# Patient Record
Sex: Female | Born: 1995 | Hispanic: Yes | Marital: Single | Smoking: Never smoker
Health system: Southern US, Community
[De-identification: ages and names within clinical notes are randomized; demographics above are authoritative.]

## PROBLEM LIST (undated history)

## (undated) DIAGNOSIS — Z789 Other specified health status: Secondary | ICD-10-CM

## (undated) HISTORY — PX: NO PAST SURGERIES: SHX2092

---
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
## 2020-01-14 ENCOUNTER — Other Ambulatory Visit: Payer: Self-pay

## 2020-01-14 ENCOUNTER — Emergency Department: Payer: No Typology Code available for payment source

## 2020-01-14 ENCOUNTER — Encounter: Payer: Self-pay | Admitting: Emergency Medicine

## 2020-01-14 ENCOUNTER — Emergency Department
Admission: EM | Admit: 2020-01-14 | Discharge: 2020-01-14 | Disposition: A | Discharge status: Home / Self Care | Payer: No Typology Code available for payment source | Attending: Emergency Medicine | Admitting: Emergency Medicine

## 2020-01-14 DIAGNOSIS — R0789 Other chest pain: Secondary | ICD-10-CM | POA: Insufficient documentation

## 2020-01-14 DIAGNOSIS — R1013 Epigastric pain: Secondary | ICD-10-CM | POA: Diagnosis not present

## 2020-01-14 DIAGNOSIS — R112 Nausea with vomiting, unspecified: Secondary | ICD-10-CM | POA: Diagnosis not present

## 2020-01-14 LAB — COMPREHENSIVE METABOLIC PANEL
ALT: 15 U/L (ref 0–44)
AST: 21 U/L (ref 15–41)
Albumin: 4 g/dL (ref 3.5–5.0)
Alkaline Phosphatase: 66 U/L (ref 38–126)
Anion gap: 9 (ref 5–15)
BUN: 11 mg/dL (ref 6–20)
CO2: 21 mmol/L — ABNORMAL LOW (ref 22–32)
Calcium: 9.1 mg/dL (ref 8.9–10.3)
Chloride: 105 mmol/L (ref 98–111)
Creatinine, Ser: 0.53 mg/dL (ref 0.44–1.00)
GFR, Estimated: >60 mL/min (ref >60)
Glucose, Bld: 111 mg/dL — ABNORMAL HIGH (ref 70–99)
Potassium: 3.2 mmol/L — ABNORMAL LOW (ref 3.5–5.1)
Sodium: 135 mmol/L (ref 135–145)
Total Bilirubin: 0.8 mg/dL (ref 0.3–1.2)
Total Protein: 8.1 g/dL (ref 6.5–8.1)

## 2020-01-14 LAB — CBC WITH DIFFERENTIAL/PLATELET
Abs Immature Granulocytes: 0.02 10*3/uL (ref 0.00–0.07)
Basophils Absolute: 0 10*3/uL (ref 0.0–0.1)
Basophils Relative: 0 %
Eosinophils Absolute: 0.1 10*3/uL (ref 0.0–0.5)
Eosinophils Relative: 2 %
HCT: 36.9 % (ref 36.0–46.0)
Hemoglobin: 12.6 g/dL (ref 12.0–15.0)
Immature Granulocytes: 0 %
Lymphocytes Relative: 37 %
Lymphs Abs: 2.8 10*3/uL (ref 0.7–4.0)
MCH: 31.3 pg (ref 26.0–34.0)
MCHC: 34.1 g/dL (ref 30.0–36.0)
MCV: 91.6 fL (ref 80.0–100.0)
Monocytes Absolute: 0.4 10*3/uL (ref 0.1–1.0)
Monocytes Relative: 5 %
Neutro Abs: 4.2 10*3/uL (ref 1.7–7.7)
Neutrophils Relative %: 56 %
Platelets: 294 10*3/uL (ref 150–400)
RBC: 4.03 MIL/uL (ref 3.87–5.11)
RDW: 12.8 % (ref 11.5–15.5)
WBC: 7.5 10*3/uL (ref 4.0–10.5)
nRBC: 0 % (ref 0.0–0.2)

## 2020-01-14 LAB — URINALYSIS, COMPLETE (UACMP) WITH MICROSCOPIC
Bacteria, UA: NONE SEEN
Bilirubin Urine: NEGATIVE
Glucose, UA: NEGATIVE mg/dL
Ketones, ur: NEGATIVE mg/dL
Leukocytes,Ua: NEGATIVE
Nitrite: NEGATIVE
Protein, ur: NEGATIVE mg/dL
Specific Gravity, Urine: 1.025 (ref 1.005–1.030)
pH: 5 (ref 5.0–8.0)

## 2020-01-14 LAB — POC URINE PREG, ED: Preg Test, Ur: NEGATIVE

## 2020-01-14 LAB — LIPASE, BLOOD: Lipase: 31 U/L (ref 11–51)

## 2020-01-14 LAB — TROPONIN I (HIGH SENSITIVITY)
Troponin I (High Sensitivity): <2 ng/L (ref <18)
Troponin I (High Sensitivity): <2 ng/L (ref <18)

## 2020-01-14 MED ORDER — OMEPRAZOLE 20 MG PO CPDR: 20.0000 mg | DELAYED_RELEASE_CAPSULE | Freq: Every day | ORAL | 1 refills | Status: DC

## 2020-01-14 NOTE — ED Provider Notes (Signed)
[facility] Emergency Department Provider Note   ____________________________________________   First MD Initiated Contact with Patient 01/14/20 [phone]     (approximate)  I have reviewed the triage vital signs and the nursing notes.   HISTORY  Chief Complaint Chest Pain and Abdominal Pain    HPI Melissa Macdonald is a 24 y.o. female with no stated past medical history the presents for chest tightness with associated midepigastric abdominal pain that began at approximately 4 AM prior to arrival.  Patient states associated nausea and one episode of nonbloody nonbilious emesis.  Patient states she has had similar symptoms in the past but always wake her from sleep and usually occur after eating a spicy or greasy meal.  Patient is concerned that she may have gallstones or gallbladder issues as her mother had similar symptoms when she had to get her gallbladder taken out.  Patient denies any dyspnea on exertion, diarrhea, fevers, vision changes, tinnitus, or weakness/numbness/paresthesias in any extremity.         History reviewed. No pertinent past medical history.  There are no problems to display for this patient.   History reviewed. No pertinent surgical history.  Prior to Admission medications   Medication Sig Start Date End Date Taking? Authorizing Provider  omeprazole (PRILOSEC) 20 MG capsule Take 1 capsule (20 mg total) by mouth at bedtime. 01/14/20 01/13/21  Merwyn Katos, MD    Allergies Patient has no known allergies.  No family history on file.  Social History Social History   Tobacco Use  . Smoking status: Never Smoker  . Smokeless tobacco: Never Used  Substance Use Topics  . Alcohol use: Not on file  . Drug use: Not on file    Review of Systems Constitutional: No fever/chills Eyes: No visual changes. ENT: No sore throat. Cardiovascular: Endorses chest pain. Respiratory: Denies shortness of breath. Gastrointestinal: No abdominal  pain.  Endorses nausea, no vomiting.  No diarrhea. Genitourinary: Negative for dysuria. Musculoskeletal: Negative for acute arthralgias Skin: Negative for rash. Neurological: Negative for headaches, weakness/numbness/paresthesias in any extremity Psychiatric: Negative for suicidal ideation/homicidal ideation   ____________________________________________   PHYSICAL EXAM:  VITAL SIGNS: ED Triage Vitals  Enc Vitals Group     BP 01/14/20 0609 115/67     Pulse Rate 01/14/20 0609 79     Resp 01/14/20 0609 20     Temp 01/14/20 0609 98.3 F (36.8 C)     Temp Source 01/14/20 0609 Oral     SpO2 01/14/20 0609 98 %     Weight 01/14/20 0608 135 lb (61.2 kg)     Height 01/14/20 0608 5\' 2"  (1.575 m)     Head Circumference --      Peak Flow --      Pain Score 01/14/20 0608 7     Pain Loc --      Pain Edu? --      Excl. in GC? --    Constitutional: Alert and oriented. Well appearing and in no acute distress. Eyes: Conjunctivae are normal. PERRL. Head: Atraumatic. Nose: No congestion/rhinnorhea. Mouth/Throat: Mucous membranes are moist. Neck: No stridor Cardiovascular: Grossly normal heart sounds.  Good peripheral circulation. Respiratory: Normal respiratory effort.  No retractions. Gastrointestinal: Soft and nontender. No distention. Musculoskeletal: No obvious deformities Neurologic:  Normal speech and language. No gross focal neurologic deficits are appreciated. Skin:  Skin is warm and dry. No rash noted. Psychiatric: Mood and affect are normal. Speech and behavior are normal.  ____________________________________________   LABS (  all labs ordered are listed, but only abnormal results are displayed)  Labs Reviewed  COMPREHENSIVE METABOLIC PANEL - Abnormal; Notable for the following components:      Result Value   Potassium 3.2 (*)    CO2 21 (*)    Glucose, Bld 111 (*)    All other components within normal limits  URINALYSIS, COMPLETE (UACMP) WITH MICROSCOPIC - Abnormal;  Notable for the following components:   Color, Urine YELLOW (*)    APPearance HAZY (*)    Hgb urine dipstick MODERATE (*)    All other components within normal limits  CBC WITH DIFFERENTIAL/PLATELET  LIPASE, BLOOD  POC URINE PREG, ED  TROPONIN I (HIGH SENSITIVITY)  TROPONIN I (HIGH SENSITIVITY)   ____________________________________________  EKG  ED ECG REPORT I, Merwyn Katos, the attending physician, personally viewed and interpreted this ECG.  Date: 01/14/2020 EKG Time: 0608 Rate: 70 Rhythm: normal sinus rhythm QRS Axis: normal Intervals: normal ST/T Wave abnormalities: normal Narrative Interpretation: no evidence of acute ischemia  ____________________________________________  RADIOLOGY  ED MD interpretation: 2 view x-ray of the chest shows no evidence of acute abnormalities including no pneumonia, pneumothorax, or widened mediastinum  Official radiology report(s): DG Chest 2 View  Result Date: 01/14/2020 CLINICAL DATA:  Chest pain EXAM: CHEST - 2 VIEW COMPARISON:  None. FINDINGS: Normal heart size and mediastinal contours. No acute infiltrate or edema. No effusion or pneumothorax. No acute osseous findings. IMPRESSION: No active cardiopulmonary disease. Electronically Signed   By: Marnee Spring M.D.   On: 01/14/2020 06:34    ____________________________________________   PROCEDURES  Procedure(s) performed (including Critical Care):  .1-3 Lead EKG Interpretation Performed by: Merwyn Katos, MD Authorized by: Merwyn Katos, MD     Interpretation: normal     ECG rate:  68   ECG rate assessment: normal     Rhythm: sinus rhythm     Ectopy: none     Conduction: normal       ____________________________________________   INITIAL IMPRESSION / ASSESSMENT AND PLAN / ED COURSE  As part of my medical decision making, I reviewed the following data within the electronic MEDICAL RECORD NUMBER Nursing notes reviewed and incorporated, Labs reviewed, EKG  interpreted, Old chart reviewed, Radiograph reviewed and Notes from prior ED visits reviewed and incorporated        Workup: ECG, CXR, CBC, BMP, Troponin Findings: ECG: No overt evidence of STEMI. No evidence of Brugadas sign, delta wave, epsilon wave, significantly prolonged QTc, or malignant arrhythmia HS Troponin: Negative x1 Other Labs unremarkable for emergent problems. CXR: Without PTX, PNA, or widened mediastinum Last Stress Test: Never Last Heart Catheterization: Never HEART Score: 1  Given History, Exam, and Workup I have low suspicion for ACS, Pneumothorax, Pneumonia, Pulmonary Embolus, Tamponade, Aortic Dissection or other emergent problem as a cause for this presentation.   Reassesment: Prior to discharge patients pain was controlled and they were well appearing.  Disposition:  Discharge. Strict return precautions discussed with patient with full understanding. Advised patient to follow up promptly with primary care provider      ____________________________________________   FINAL CLINICAL IMPRESSION(S) / ED DIAGNOSES  Final diagnoses:  Chest tightness  Nausea and vomiting, intractability of vomiting not specified, unspecified vomiting type     ED Discharge Orders         Ordered    US Abdomen Limited RUQ (LIVER/GB)        01/14/20 0954    omeprazole (PRILOSEC) 20 MG capsule  Daily  at bedtime        01/14/20 5638           Note:  This document was prepared using Dragon voice recognition software and may include unintentional dictation errors.   Merwyn Katos, MD 01/14/20 1000

## 2020-01-14 NOTE — ED Triage Notes (Signed)
Patient ambulatory to triage with steady gait, without difficulty or distress noted; pt reports since 4am having chest and abd pain accomp by nausea

## 2020-02-02 ENCOUNTER — Ambulatory Visit: Payer: PRIVATE HEALTH INSURANCE

## 2020-03-06 ENCOUNTER — Encounter: Payer: Self-pay | Admitting: Emergency Medicine

## 2020-03-06 ENCOUNTER — Other Ambulatory Visit: Payer: Self-pay

## 2020-03-06 DIAGNOSIS — K8050 Calculus of bile duct without cholangitis or cholecystitis without obstruction: Secondary | ICD-10-CM | POA: Diagnosis not present

## 2020-03-06 DIAGNOSIS — R1013 Epigastric pain: Secondary | ICD-10-CM | POA: Diagnosis present

## 2020-03-06 NOTE — ED Triage Notes (Signed)
Patient with complaint of epigastric pain that radiates into her back that started yesterday. Patient states that she was seen her a month ago for the same and diagnosed with acid reflux. Patient states that she has not taken the medication that she was prescribed.

## 2020-03-07 ENCOUNTER — Emergency Department
Admission: EM | Admit: 2020-03-07 | Discharge: 2020-03-07 | Disposition: A | Discharge status: Home / Self Care | Payer: No Typology Code available for payment source | Attending: Emergency Medicine | Admitting: Emergency Medicine

## 2020-03-07 ENCOUNTER — Emergency Department: Payer: No Typology Code available for payment source

## 2020-03-07 DIAGNOSIS — K8050 Calculus of bile duct without cholangitis or cholecystitis without obstruction: Secondary | ICD-10-CM

## 2020-03-07 DIAGNOSIS — R1013 Epigastric pain: Secondary | ICD-10-CM

## 2020-03-07 LAB — COMPREHENSIVE METABOLIC PANEL
ALT: 14 U/L (ref 0–44)
AST: 17 U/L (ref 15–41)
Albumin: 4.2 g/dL (ref 3.5–5.0)
Alkaline Phosphatase: 68 U/L (ref 38–126)
Anion gap: 9 (ref 5–15)
BUN: 10 mg/dL (ref 6–20)
CO2: 23 mmol/L (ref 22–32)
Calcium: 9 mg/dL (ref 8.9–10.3)
Chloride: 103 mmol/L (ref 98–111)
Creatinine, Ser: 0.47 mg/dL (ref 0.44–1.00)
GFR, Estimated: >60 mL/min (ref >60)
Glucose, Bld: 108 mg/dL — ABNORMAL HIGH (ref 70–99)
Potassium: 3.2 mmol/L — ABNORMAL LOW (ref 3.5–5.1)
Sodium: 135 mmol/L (ref 135–145)
Total Bilirubin: 0.8 mg/dL (ref 0.3–1.2)
Total Protein: 8.1 g/dL (ref 6.5–8.1)

## 2020-03-07 LAB — CBC
HCT: 35.4 % — ABNORMAL LOW (ref 36.0–46.0)
Hemoglobin: 12.1 g/dL (ref 12.0–15.0)
MCH: 31.1 pg (ref 26.0–34.0)
MCHC: 34.2 g/dL (ref 30.0–36.0)
MCV: 91 fL (ref 80.0–100.0)
Platelets: 256 10*3/uL (ref 150–400)
RBC: 3.89 MIL/uL (ref 3.87–5.11)
RDW: 12.7 % (ref 11.5–15.5)
WBC: 9.9 10*3/uL (ref 4.0–10.5)
nRBC: 0 % (ref 0.0–0.2)

## 2020-03-07 LAB — URINALYSIS, COMPLETE (UACMP) WITH MICROSCOPIC
Bacteria, UA: NONE SEEN
Bilirubin Urine: NEGATIVE
Glucose, UA: NEGATIVE mg/dL
Ketones, ur: NEGATIVE mg/dL
Leukocytes,Ua: NEGATIVE
Nitrite: NEGATIVE
Protein, ur: NEGATIVE mg/dL
Specific Gravity, Urine: 1.014 (ref 1.005–1.030)
pH: 7 (ref 5.0–8.0)

## 2020-03-07 LAB — TROPONIN I (HIGH SENSITIVITY): Troponin I (High Sensitivity): <2 ng/L (ref <18)

## 2020-03-07 LAB — LIPASE, BLOOD: Lipase: 31 U/L (ref 11–51)

## 2020-03-07 LAB — POC URINE PREG, ED: Preg Test, Ur: NEGATIVE

## 2020-03-07 MED ORDER — ONDANSETRON 4 MG PO TBDP: ORAL_TABLET | ORAL | 0 refills | Status: DC

## 2020-03-07 MED ORDER — DOCUSATE SODIUM 100 MG PO CAPS: ORAL_CAPSULE | ORAL | 0 refills | Status: DC

## 2020-03-07 MED ORDER — HYDROCODONE-ACETAMINOPHEN 5-325 MG PO TABS: 2.0000 | ORAL_TABLET | Freq: Four times a day (QID) | ORAL | 0 refills | Status: DC | PRN

## 2020-03-07 MED ORDER — POTASSIUM CHLORIDE CRYS ER 20 MEQ PO TBCR
40.0000 meq | EXTENDED_RELEASE_TABLET | Freq: Once | ORAL | Status: AC
  Administered 2020-03-07: 40 meq via ORAL
  Filled 2020-03-07: qty 2

## 2020-03-07 NOTE — Discharge Instructions (Addendum)
You have been seen in the Emergency Department (ED) for abdominal pain.  Your evaluation suggests that your pain is caused by gallstones.  Fortunately you do not need immediate surgery at this time, but it is important that you follow up with a surgeon as an outpatient; typically surgical removal of the gallbladder is the only thing that will definitively fix your issue.  Read through the included information about a bland diet, and use any prescribed medications as instructed.  Avoid smoking and alcohol use. ? ?Please follow up as instructed above regarding today?s emergent visit and the symptoms that are bothering you. ? ?Take Norco as prescribed. Do not drink alcohol, drive or participate in any other potentially dangerous activities while taking this medication as it may make you sleepy. Do not take this medication with any other sedating medications, either prescription or over-the-counter. If you were prescribed Percocet or Vicodin, do not take these with acetaminophen (Tylenol) as it is already contained within these medications. ?  ?This medication is an opiate (or narcotic) pain medication and can be habit forming.  Use it as little as possible to achieve adequate pain control.  Do not use or use it with extreme caution if you have a history of opiate abuse or dependence.  If you are on a pain contract with your primary care doctor or a pain specialist, be sure to let them know you were prescribed this medication today from the [facility].  This medication is intended for your use only - do not give any to anyone else and keep it in a secure place where nobody else, especially children, have access to it.  It will also cause or worsen constipation, so you may want to consider taking an over-the-counter stool softener while you are taking this medication. ? ?Return to the ED if your abdominal pain worsens or fails to improve, you develop bloody vomiting, bloody diarrhea, you are  unable to tolerate fluids due to vomiting, fever greater than 101, or other symptoms that concern you. ? ?

## 2020-03-07 NOTE — ED Notes (Signed)
ED Provider at bedside. 

## 2020-03-07 NOTE — ED Provider Notes (Signed)
[facility] Emergency Department Provider Note  ____________________________________________   Event Date/Time   First MD Initiated Contact with Patient 03/07/20 [phone]     (approximate)  I have reviewed the triage vital signs and the nursing notes.   HISTORY  Chief Complaint Abdominal Pain    HPI Melissa Macdonald is a 25 y.o. female reports no chronic medical issues except for some recent issues with acid reflux.  She presents tonight for persistent aching severe epigastric pain that has been present for about 3 days.  She said it was severe but has gotten considerably better and is now mild but she can still feel it.  She has had nausea but no vomiting.  Eating and drinking makes it worse and it started acutely after eating something several days ago.  Acid reflux medicine does not seem to help.  Her mother has a history gallstones but she does not have a personal history.  She denies fever, sore throat, chest pain, shortness of breath, diarrhea, and lower abdominal pain.  Nothing in particular made the symptoms better, eating made it worse.         History reviewed. No pertinent past medical history.  There are no problems to display for this patient.   History reviewed. No pertinent surgical history.  Prior to Admission medications   Medication Sig Start Date End Date Taking? Authorizing Provider  docusate sodium (COLACE) 100 MG capsule Take 1 tablet once or twice daily as needed for constipation while taking narcotic pain medicine 03/07/20  Yes Loleta Rose, MD  HYDROcodone-acetaminophen (NORCO/VICODIN) 5-325 MG tablet Take 2 tablets by mouth every 6 (six) hours as needed for moderate pain or severe pain. 03/07/20  Yes Loleta Rose, MD  ondansetron (ZOFRAN ODT) 4 MG disintegrating tablet Allow 1-2 tablets to dissolve in your mouth every 8 hours as needed for nausea/vomiting 03/07/20  Yes Loleta Rose, MD  omeprazole (PRILOSEC) 20 MG capsule Take 1  capsule (20 mg total) by mouth at bedtime. 01/14/20 01/13/21  Merwyn Katos, MD    Allergies Patient has no known allergies.  No family history on file.  Social History Social History   Tobacco Use  . Smoking status: Never Smoker  . Smokeless tobacco: Never Used    Review of Systems Constitutional: No fever/chills Eyes: No visual changes. ENT: No sore throat. Cardiovascular: Denies chest pain. Respiratory: Denies shortness of breath. Gastrointestinal: Upper abdominal pain, nausea, no vomiting, no diarrhea. Genitourinary: Negative for dysuria. Musculoskeletal: Negative for neck pain.  Negative for back pain. Integumentary: Negative for rash. Neurological: Negative for headaches, focal weakness or numbness.   ____________________________________________   PHYSICAL EXAM:  VITAL SIGNS: ED Triage Vitals  Enc Vitals Group     BP 03/06/20 2348 124/74     Pulse Rate 03/06/20 2348 60     Resp 03/06/20 2348 18     Temp 03/06/20 2348 98.3 F (36.8 C)     Temp Source 03/06/20 2348 Oral     SpO2 03/06/20 2348 100 %     Weight 03/06/20 2350 59 kg (130 lb)     Height 03/06/20 2350 1.575 m (5\' 2" )     Head Circumference --      Peak Flow --      Pain Score 03/06/20 2350 7     Pain Loc --      Pain Edu? --      Excl. in GC? --     Constitutional: Alert and oriented.  Eyes: Conjunctivae  are normal.  Head: Atraumatic. Nose: No congestion/rhinnorhea. Mouth/Throat: Patient is wearing a mask. Neck: No stridor.  No meningeal signs.   Cardiovascular: Normal rate, regular rhythm. Good peripheral circulation. Respiratory: Normal respiratory effort.  No retractions. Gastrointestinal: Soft and nondistended.  No tenderness to palpation including in the lower abdomen, epigastrium, and right upper quadrant, with negative Murphy sign. Musculoskeletal: No lower extremity tenderness nor edema. No gross deformities of extremities. Neurologic:  Normal speech and language. No gross focal  neurologic deficits are appreciated.  Skin:  Skin is warm, dry and intact. Psychiatric: Mood and affect are normal. Speech and behavior are normal.  ____________________________________________   LABS (all labs ordered are listed, but only abnormal results are displayed)  Labs Reviewed  COMPREHENSIVE METABOLIC PANEL - Abnormal; Notable for the following components:      Result Value   Potassium 3.2 (*)    Glucose, Bld 108 (*)    All other components within normal limits  CBC - Abnormal; Notable for the following components:   HCT 35.4 (*)    All other components within normal limits  URINALYSIS, COMPLETE (UACMP) WITH MICROSCOPIC - Abnormal; Notable for the following components:   Color, Urine YELLOW (*)    APPearance CLEAR (*)    Hgb urine dipstick SMALL (*)    All other components within normal limits  LIPASE, BLOOD  POC URINE PREG, ED  TROPONIN I (HIGH SENSITIVITY)  TROPONIN I (HIGH SENSITIVITY)   ____________________________________________  EKG  ED ECG REPORT I, Loleta Rose, the attending physician, personally viewed and interpreted this ECG.  Date: 03/06/2020 EKG Time: 23: 59 Rate: 69 Rhythm: normal sinus rhythm QRS Axis: normal Intervals: Incomplete right bundle branch block ST/T Wave abnormalities: normal Narrative Interpretation: no evidence of acute ischemia  ____________________________________________  RADIOLOGY I, Loleta Rose, personally viewed and evaluated these images (plain radiographs) as part of my medical decision making, as well as reviewing the written report by the radiologist.  ED MD interpretation: Stones and sludge, no substantial gallbladder wall thickening, no pericholecystic fluid  Official radiology report(s): US Abdomen Limited RUQ (LIVER/GB)  Result Date: 03/07/2020 CLINICAL DATA:  Initial evaluation for acute epigastric pain. EXAM: ULTRASOUND ABDOMEN LIMITED RIGHT UPPER QUADRANT COMPARISON:  None. FINDINGS: Gallbladder:  Gallbladder mildly distended. Multiple scattered echogenic stones seen within the gallbladder lumen, largest of which measures 1.1 cm. Superimposed sludge. Extension into the proximal cystic duct noted. Gallbladder minimally thickened up to 3.3 mm. No free pericholecystic fluid. No sonographic Murphy sign elicited on exam. Common bile duct: Diameter: 3.9 mm Liver: No focal lesion identified. Within normal limits in parenchymal echogenicity. Portal vein is patent on color Doppler imaging with normal direction of blood flow towards the liver. Other: None. IMPRESSION: 1. Stones and sludge within the gallbladder lumen with extension into the proximal cystic duct. Associated minimal gallbladder wall thickening, but no other sonographic features for acute cholecystitis. 2. No biliary dilatation. Electronically Signed   By: Rise Mu M.D.   On: 03/07/2020 03:19    ____________________________________________   PROCEDURES   Procedure(s) performed (including Critical Care):  Procedures   ____________________________________________   INITIAL IMPRESSION / MDM / ASSESSMENT AND PLAN / ED COURSE  As part of my medical decision making, I reviewed the following data within the electronic MEDICAL RECORD NUMBER Nursing notes reviewed and incorporated, Labs reviewed , Old chart reviewed, Notes from prior ED visits and [place] Controlled Substance Database   Differential diagnosis includes, but is not limited to, biliary colic, cholecystitis, acid reflux, ulcer.  Patient's symptoms strongly suggest biliary colic.  She is much better now and reassuringly she has no tenderness to palpation of the abdomen including the epigastrium and right upper quadrant.  Vital signs are normal and stable.  She has a normal comprehensive metabolic panel other than a very mild hypokalemia with no LFT elevation.  Lipase is normal.  CBC is normal with no leukocytosis.  No evidence of acute infection on urinalysis.  Although her  ultrasound is borderline, her physical exam is reassuring with no tenderness to palpation and she is not vomiting and is able to tolerate oral intake.  I had my usual and customary gallstone discussion with the patient and strongly encouraged close follow-up with surgery.  She understands the importance.  I also gave her information about biliary colic and a gallbladder eating plan.  I wrote prescriptions as listed below after verifying in the controlled substance database that she does not a significant risk of abuse.  She knows to return to the ED for new or worsening symptoms.           ____________________________________________  FINAL CLINICAL IMPRESSION(S) / ED DIAGNOSES  Final diagnoses:  Epigastric pain  Biliary colic     MEDICATIONS GIVEN DURING THIS VISIT:  Medications  potassium chloride SA (KLOR-CON) CR tablet 40 mEq (has no administration in time range)     ED Discharge Orders         Ordered    ondansetron (ZOFRAN ODT) 4 MG disintegrating tablet        03/07/20 0614    HYDROcodone-acetaminophen (NORCO/VICODIN) 5-325 MG tablet  Every 6 hours PRN        03/07/20 0614    docusate sodium (COLACE) 100 MG capsule        03/07/20 4098          *Please note:  Melissa Macdonald was evaluated in Emergency Department on 03/07/2020 for the symptoms described in the history of present illness. She was evaluated in the context of the global COVID-19 pandemic, which necessitated consideration that the patient might be at risk for infection with the SARS-CoV-2 virus that causes COVID-19. Institutional protocols and algorithms that pertain to the evaluation of patients at risk for COVID-19 are in a state of rapid change based on information released by regulatory bodies including the CDC and federal and state organizations. These policies and algorithms were followed during the patient's care in the ED.  Some ED evaluations and interventions may be delayed as a result of limited  staffing during and after the pandemic.*  Note:  This document was prepared using Dragon voice recognition software and may include unintentional dictation errors.   Loleta Rose, MD 03/07/20 [phone]

## 2020-03-10 ENCOUNTER — Other Ambulatory Visit: Payer: Self-pay

## 2020-03-10 ENCOUNTER — Ambulatory Visit: Payer: Self-pay | Admitting: Surgery

## 2020-03-10 ENCOUNTER — Encounter: Payer: Self-pay | Admitting: Surgery

## 2020-03-10 ENCOUNTER — Ambulatory Visit: Payer: PRIVATE HEALTH INSURANCE | Admitting: Surgery

## 2020-03-10 VITALS — BP 116/77 | HR 85 | Temp 98.3°F | Ht 62.0 in | Wt 139.4 lb

## 2020-03-10 DIAGNOSIS — K8020 Calculus of gallbladder without cholecystitis without obstruction: Secondary | ICD-10-CM | POA: Diagnosis not present

## 2020-03-10 NOTE — Progress Notes (Signed)
03/10/2020  Reason for Visit:  Symptomatic cholelithiasis  History of Present Illness: Melissa Macdonald is a 25 y.o. female presenting for evaluation of symptomatic cholelithiasis.  The patient reports that she's been having for a long time episodes of epigastric abdominal pain that happens at nighttime, which only last for about 1-2 hours.  They have been less severe in intensity, until this past weekend that the episode was more severe and lasted much longer.  Also associated with nausea.  She presented to the ED and her workup showed overall normal WBC, normal LFTs, and an ultrasound showing cholelithiasis with sludge.  She was feeling better after medical management in the ED and was discharged home.    She reports that overall the episodes have become much more frequent and she has pain basically on a daily basis.  She has pain after eating, but she's able to keep down fluids and soups.  The pain does not radiate.  Denies any fevers, chills, chest pain, shortness of breath, though the pain is sometimes severe enough that is harder to take a deep breath.  She denies any heartburn issues and does not have GERD history.  Of note, she and her mother were going to travel out of the country today but given her symptoms, they canceled the trip.  Past Medical History: --None   Past Surgical History: --None  Home Medications: Prior to Admission medications   Medication Sig Start Date End Date Taking? Authorizing Provider  HYDROcodone-acetaminophen (NORCO/VICODIN) 5-325 MG tablet Take 2 tablets by mouth every 6 (six) hours as needed for moderate pain or severe pain. 03/07/20  Yes Hinda Kehr, MD    Allergies: No Known Allergies  Social History:  reports that she has never smoked. She has never used smokeless tobacco. She reports that she does not drink alcohol and does not use drugs.   Family History: History reviewed. No pertinent family history.  Review of Systems: Review of Systems   Constitutional: Negative for chills and fever.  HENT: Negative for hearing loss.   Respiratory: Negative for shortness of breath.   Cardiovascular: Negative for chest pain.  Gastrointestinal: Positive for abdominal pain and nausea. Negative for constipation, diarrhea, heartburn and vomiting.  Genitourinary: Negative for dysuria.  Musculoskeletal: Negative for myalgias.  Skin: Negative for rash.  Neurological: Negative for dizziness.  Psychiatric/Behavioral: Negative for depression.    Physical Exam BP 116/77   Pulse 85   Temp 98.3 F (36.8 C) (Oral)   Ht 5' 2"  (1.575 m)   Wt 139 lb 6.4 oz (63.2 kg)   LMP 03/03/2020 (Exact Date)   SpO2 98%   BMI 25.50 kg/m  CONSTITUTIONAL: No acute distress HEENT:  Normocephalic, atraumatic, extraocular motion intact. NECK: Trachea is midline, and there is no jugular venous distension.  RESPIRATORY:  Lungs are clear, and breath sounds are equal bilaterally. Normal respiratory effort without pathologic use of accessory muscles. CARDIOVASCULAR: Heart is regular without murmurs, gallops, or rubs. GI: The abdomen is soft, non-distended, with some discomfort in the epigastric area.  Negative Murphy's sign.  MUSCULOSKELETAL:  Normal muscle strength and tone in all four extremities.  No peripheral edema or cyanosis. SKIN: Skin turgor is normal. There are no pathologic skin lesions.  NEUROLOGIC:  Motor and sensation is grossly normal.  Cranial nerves are grossly intact. PSYCH:  Alert and oriented to person, place and time. Affect is normal.  Laboratory Analysis: Na 135, K 3.2, Cl 103, CO2 23, BUN 10, Cr 0.47.  Total bili 0.8, AST  17, ALT 14, Alk Phos 68.  WBC 9.9, Hgb 12.1, Hct 35.4, Plt 256.  Imaging: U/S on 03/07/20: IMPRESSION: 1. Stones and sludge within the gallbladder lumen with extension into the proximal cystic duct. Associated minimal gallbladder wall thickening, but no other sonographic features for acute cholecystitis. 2. No biliary  dilatation.  Assessment and Plan: This is a 25 y.o. female with symptomatic cholelithiasis, with progressively worsening symptoms.  --Discussed with the patient the laboratory findings and ultrasound findings consistent with symptomatic cholelithiasis.  Discussed with her the role for cholecystectomy in this scenario, given her worsening symptoms.  She would be a good candidate for robotic approach.  Reviewed with her the surgery at length, including risks of bleeding, infection, and injury to surrounding structures.  She's willing to proceed.  She understands that she would need a COVID-19 test prior to surgery.   --Will schedule her for 03/16/20  Face-to-face time spent with the patient and care providers was 45 minutes, with more than 50% of the time spent counseling, educating, and coordinating care of the patient.     Melvyn Neth, [facility]

## 2020-03-10 NOTE — H&P (View-Only) (Signed)
03/10/2020  Reason for Visit:  Symptomatic cholelithiasis  History of Present Illness: Melissa Macdonald is a 25 y.o. female presenting for evaluation of symptomatic cholelithiasis.  The patient reports that she's been having for a long time episodes of epigastric abdominal pain that happens at nighttime, which only last for about 1-2 hours.  They have been less severe in intensity, until this past weekend that the episode was more severe and lasted much longer.  Also associated with nausea.  She presented to the ED and her workup showed overall normal WBC, normal LFTs, and an ultrasound showing cholelithiasis with sludge.  She was feeling better after medical management in the ED and was discharged home.    She reports that overall the episodes have become much more frequent and she has pain basically on a daily basis.  She has pain after eating, but she's able to keep down fluids and soups.  The pain does not radiate.  Denies any fevers, chills, chest pain, shortness of breath, though the pain is sometimes severe enough that is harder to take a deep breath.  She denies any heartburn issues and does not have GERD history.  Of note, she and her mother were going to travel out of the country today but given her symptoms, they canceled the trip.  Past Medical History: --None   Past Surgical History: --None  Home Medications: Prior to Admission medications   Medication Sig Start Date End Date Taking? Authorizing Provider  HYDROcodone-acetaminophen (NORCO/VICODIN) 5-325 MG tablet Take 2 tablets by mouth every 6 (six) hours as needed for moderate pain or severe pain. 03/07/20  Yes Hinda Kehr, MD    Allergies: No Known Allergies  Social History:  reports that she has never smoked. She has never used smokeless tobacco. She reports that she does not drink alcohol and does not use drugs.   Family History: History reviewed. No pertinent family history.  Review of Systems: Review of Systems   Constitutional: Negative for chills and fever.  HENT: Negative for hearing loss.   Respiratory: Negative for shortness of breath.   Cardiovascular: Negative for chest pain.  Gastrointestinal: Positive for abdominal pain and nausea. Negative for constipation, diarrhea, heartburn and vomiting.  Genitourinary: Negative for dysuria.  Musculoskeletal: Negative for myalgias.  Skin: Negative for rash.  Neurological: Negative for dizziness.  Psychiatric/Behavioral: Negative for depression.    Physical Exam BP 116/77   Pulse 85   Temp 98.3 F (36.8 C) (Oral)   Ht 5' 2"  (1.575 m)   Wt 139 lb 6.4 oz (63.2 kg)   LMP 03/03/2020 (Exact Date)   SpO2 98%   BMI 25.50 kg/m  CONSTITUTIONAL: No acute distress HEENT:  Normocephalic, atraumatic, extraocular motion intact. NECK: Trachea is midline, and there is no jugular venous distension.  RESPIRATORY:  Lungs are clear, and breath sounds are equal bilaterally. Normal respiratory effort without pathologic use of accessory muscles. CARDIOVASCULAR: Heart is regular without murmurs, gallops, or rubs. GI: The abdomen is soft, non-distended, with some discomfort in the epigastric area.  Negative Murphy's sign.  MUSCULOSKELETAL:  Normal muscle strength and tone in all four extremities.  No peripheral edema or cyanosis. SKIN: Skin turgor is normal. There are no pathologic skin lesions.  NEUROLOGIC:  Motor and sensation is grossly normal.  Cranial nerves are grossly intact. PSYCH:  Alert and oriented to person, place and time. Affect is normal.  Laboratory Analysis: Na 135, K 3.2, Cl 103, CO2 23, BUN 10, Cr 0.47.  Total bili 0.8, AST  17, ALT 14, Alk Phos 68.  WBC 9.9, Hgb 12.1, Hct 35.4, Plt 256.  Imaging: U/S on 03/07/20: IMPRESSION: 1. Stones and sludge within the gallbladder lumen with extension into the proximal cystic duct. Associated minimal gallbladder wall thickening, but no other sonographic features for acute cholecystitis. 2. No biliary  dilatation.  Assessment and Plan: This is a 25 y.o. female with symptomatic cholelithiasis, with progressively worsening symptoms.  --Discussed with the patient the laboratory findings and ultrasound findings consistent with symptomatic cholelithiasis.  Discussed with her the role for cholecystectomy in this scenario, given her worsening symptoms.  She would be a good candidate for robotic approach.  Reviewed with her the surgery at length, including risks of bleeding, infection, and injury to surrounding structures.  She's willing to proceed.  She understands that she would need a COVID-19 test prior to surgery.   --Will schedule her for 03/16/20  Face-to-face time spent with the patient and care providers was 45 minutes, with more than 50% of the time spent counseling, educating, and coordinating care of the patient.     Melvyn Neth, [facility]

## 2020-03-10 NOTE — Patient Instructions (Addendum)
Our surgery scheduler Barbara will call you within 24-48 hours to get you scheduled. If you have not heard from her after 48 hours, please call our office. You will need to get Covid tested before surgery and have the blue sheet available when she calls to write down important information. If you have any concerns or questions, please feel free to call our office.     Minimally Invasive Cholecystectomy Minimally invasive cholecystectomy is surgery to remove the gallbladder. The gallbladder is a pear-shaped organ that lies beneath the liver on the right side of the body. The gallbladder stores bile, which is a fluid that helps the body digest fats. Cholecystectomy is often done to treat inflammation of the gallbladder (cholecystitis). This condition is usually caused by a buildup of gallstones (cholelithiasis) in the gallbladder. Gallstones can block the flow of bile, which can result in inflammation and pain. In severe cases, emergency surgery may be required. This procedure is done though small incisions in the abdomen, instead of one large incision. It is also called laparoscopic surgery. A thin scope with a camera (laparoscope) is inserted through one incision. Then surgical instruments are inserted through the other incisions. In some cases, a minimally invasive surgery may need to be changed to a surgery that is done through a larger incision. This is called open surgery. Tell a health care provider about:  Any allergies you have.  All medicines you are taking, including vitamins, herbs, eye drops, creams, and over-the-counter medicines.  Any problems you or family members have had with anesthetic medicines.  Any blood disorders you have.  Any surgeries you have had.  Any medical conditions you have.  Whether you are pregnant or may be pregnant. What are the risks? Generally, this is a safe procedure. However, problems may occur, including:  Infection.  Bleeding.  Allergic reactions  to medicines.  Damage to nearby structures or organs.  A stone remaining in the common bile duct. The common bile duct carries bile from the gallbladder into the small intestine.  A bile leak from the cyst duct that is clipped when your gallbladder is removed. What happens before the procedure? Staying hydrated Follow instructions from your health care provider about hydration, which may include:  Up to 2 hours before the procedure - you may continue to drink clear liquids, such as water, clear fruit juice, black coffee, and plain tea.   Eating and drinking restrictions Follow instructions from your health care provider about eating and drinking, which may include:  8 hours before the procedure - stop eating heavy meals or foods, such as meat, fried foods, or fatty foods.  6 hours before the procedure - stop eating light meals or foods, such as toast or cereal.  6 hours before the procedure - stop drinking milk or drinks that contain milk.  2 hours before the procedure - stop drinking clear liquids. Medicines Ask your health care provider about:  Changing or stopping your regular medicines. This is especially important if you are taking diabetes medicines or blood thinners.  Taking medicines such as aspirin and ibuprofen. These medicines can thin your blood. Do not take these medicines unless your health care provider tells you to take them.  Taking over-the-counter medicines, vitamins, herbs, and supplements. General instructions  Let your health care provider know if you develop a cold or an infection before surgery.  Plan to have someone take you home from the hospital or clinic.  If you will be going home right after   the procedure, plan to have someone with you for 24 hours.  Ask your health care provider: ? How your surgery site will be marked. ? What steps will be taken to help prevent infection. These may include:  Removing hair at the surgery site.  Washing skin  with a germ-killing soap.  Taking antibiotic medicine. What happens during the procedure?  An IV will be inserted into one of your veins.  You will be given one or both of the following: ? A medicine to help you relax (sedative). ? A medicine to make you fall asleep (general anesthetic).  A breathing tube will be placed in your mouth.  Your surgeon will make several small incisions in your abdomen.  The laparoscope will be inserted through one of the small incisions. The camera on the laparoscope will send images to a monitor in the operating room. This lets your surgeon see inside your abdomen.  A gas will be pumped into your abdomen. This will expand your abdomen to give the surgeon more room to perform the surgery.  Other tools that are needed for the procedure will be inserted through the other incisions. The gallbladder will be removed through one of the incisions.  Your common bile duct may be examined. If stones are found in the common bile duct, they may be removed.  After your gallbladder has been removed, the incisions will be closed with stitches (sutures), staples, or skin glue.  Your incisions may be covered with a bandage (dressing). The procedure may vary among health care providers and hospitals.   What happens after the procedure?  Your blood pressure, heart rate, breathing rate, and blood oxygen level will be monitored until you leave the hospital or clinic.  You will be given medicines as needed to control your pain.  If you were given a sedative during the procedure, it can affect you for several hours. Do not drive or operate machinery until your health care provider says that it is safe. Summary  Minimally invasive cholecystectomy, also called laparoscopic cholecystectomy, is surgery to remove the gallbladder using small incisions.  Tell your health care provider about all the medical conditions you have and all the medicines you are taking for those  conditions.  Before the procedure, follow instructions about eating or drinking restrictions and changing or stopping medicines.  If you were given a sedative during the procedure, it can affect you for several hours. Do not drive or operate machinery until your health care provider says that it is safe. This information is not intended to replace advice given to you by your health care provider. Make sure you discuss any questions you have with your health care provider. Document Revised: 10/28/2018 Document Reviewed: 10/28/2018 Elsevier Patient Education  2021 [employer]  

## 2020-03-11 ENCOUNTER — Telehealth: Payer: Self-pay | Admitting: Surgery

## 2020-03-11 NOTE — Telephone Encounter (Signed)
Incoming call from the patient.  Patient is aware of all dates regarding her surgery and voices understanding.

## 2020-03-11 NOTE — Telephone Encounter (Signed)
Pt returned call from surgery scheduler; acknowledged understanding of all info provided in previous msg.  Thank you

## 2020-03-11 NOTE — Telephone Encounter (Signed)
Outgoing call is made, left message for patient to call.  Please advise patient of Pre-Admission date/time, COVID Testing date and Surgery date.  Surgery Date: 03/16/20 Preadmission Testing Date: 03/12/20 (phone 8a-1p) Covid Testing Date: 03/15/20 - patient advised to [facility] to [facility] ([address] [facility]) between 8a-1p   Also patient to call at [phone], between 1-3:00pm the day before surgery, to find out what time to arrive for surgery.

## 2020-03-12 ENCOUNTER — Encounter
Admission: RE | Admit: 2020-03-12 | Discharge: 2020-03-12 | Disposition: A | Discharge status: Home / Self Care | Payer: PRIVATE HEALTH INSURANCE | Source: Ambulatory Visit | Attending: Surgery | Admitting: Surgery

## 2020-03-12 ENCOUNTER — Other Ambulatory Visit: Payer: Self-pay

## 2020-03-12 DIAGNOSIS — Z01812 Encounter for preprocedural laboratory examination: Secondary | ICD-10-CM | POA: Insufficient documentation

## 2020-03-12 DIAGNOSIS — Z20822 Contact with and (suspected) exposure to covid-19: Secondary | ICD-10-CM | POA: Diagnosis not present

## 2020-03-12 DIAGNOSIS — Z01818 Encounter for other preprocedural examination: Secondary | ICD-10-CM | POA: Insufficient documentation

## 2020-03-12 HISTORY — DX: Other specified health status: Z78.9

## 2020-03-12 NOTE — Patient Instructions (Signed)
Your procedure is scheduled on:03-16-20 TUESDAY Report to the Registration Desk on the 1st floor of the [facility]-Then proceed to the 2nd floor Surgery Desk in the Medical [facility] To find out your arrival time, please call [phone] between 1PM - 3PM on:03-15-20 MONDAY  REMEMBER: Instructions that are not followed completely may result in serious medical risk, up to and including death; or upon the discretion of your surgeon and anesthesiologist your surgery may need to be rescheduled.  Do not eat food after midnight the night before surgery.  No gum chewing, lozengers or hard candies.  You may however, drink CLEAR liquids up to 2 hours before you are scheduled to arrive for your surgery. Do not drink anything within 2 hours of your scheduled arrival time.  Clear liquids include: - water  - apple juice without pulp - gatorade - black coffee or tea (Do NOT add milk or creamers to the coffee or tea) Do NOT drink anything that is not on this list.  TAKE THESE MEDICATIONS THE MORNING OF SURGERY WITH A SIP OF WATER: -YOU MAY TAKE HYDROCODONE FOR PAIN IF NEEDED  One week prior to surgery: Stop Anti-inflammatories (NSAIDS) such as Advil, Aleve, Ibuprofen, Motrin, Naproxen, Naprosyn and Aspirin based products such as Excedrin, Goodys Powder, BC Powder-OK TO TAKE TYLENOL/HYDROCODONE IF NEEDED  Stop ANY OVER THE COUNTER supplements until after surgery.  No Alcohol for 24 hours before or after surgery.  No Smoking including e-cigarettes for 24 hours prior to surgery.  No chewable tobacco products for at least 6 hours prior to surgery.  No nicotine patches on the day of surgery.  Do not use any "recreational" drugs for at least a week prior to your surgery.  Please be advised that the combination of cocaine and anesthesia may have negative outcomes, up to and including death. If you test positive for cocaine, your surgery will be cancelled.  On the morning of surgery brush your teeth with  toothpaste and water, you may rinse your mouth with mouthwash if you wish. Do not swallow any toothpaste or mouthwash.  Do not wear jewelry, make-up, hairpins, clips or nail polish.  Do not wear lotions, powders, or perfumes.   Do not shave body from the neck down 48 hours prior to surgery just in case you cut yourself which could leave a site for infection.  Also, freshly shaved skin may become irritated if using the CHG soap.  Contact lenses, hearing aids and dentures may not be worn into surgery.  Do not bring valuables to the hospital. [facility] is not responsible for any missing/lost belongings or valuables.   Use CHG Soap as directed on instruction sheet.  Notify your doctor if there is any change in your medical condition (cold, fever, infection).  Wear comfortable clothing (specific to your surgery type) to the hospital.  Plan for stool softeners for home use; pain medications have a tendency to cause constipation. You can also help prevent constipation by eating foods high in fiber such as fruits and vegetables and drinking plenty of fluids as your diet allows.  After surgery, you can help prevent lung complications by doing breathing exercises.  Take deep breaths and cough every 1-2 hours. Your doctor may order a device called an Incentive Spirometer to help you take deep breaths. When coughing or sneezing, hold a pillow firmly against your incision with both hands. This is called "splinting." Doing this helps protect your incision. It also decreases belly discomfort.  If you are  being admitted to the hospital overnight, leave your suitcase in the car. After surgery it may be brought to your room.  If you are being discharged the day of surgery, you will not be allowed to drive home. You will need a responsible adult (18 years or older) to drive you home and stay with you that night.   If you are taking public transportation, you will need to have a responsible adult (18  years or older) with you. Please confirm with your physician that it is acceptable to use public transportation.   Please call the Pre-admissions Testing Dept. at [phone] if you have any questions about these instructions.  Visitation Policy:  Patients undergoing a surgery or procedure may have one family member or support person with them as long as that person is not COVID-19 positive or experiencing its symptoms.  That person may remain in the waiting area during the procedure.  Inpatient Visitation:    Visiting hours are 7 a.m. to 8 p.m. Patients will be allowed one visitor. The visitor may change daily. The visitor must pass COVID-19 screenings, use hand sanitizer when entering and exiting the patient's room and wear a mask at all times, including in the patient's room. Patients must also wear a mask when staff or their visitor are in the room. Masking is required regardless of vaccination status. Systemwide, no visitors 17 or younger.

## 2020-03-15 ENCOUNTER — Other Ambulatory Visit: Payer: Self-pay

## 2020-03-15 ENCOUNTER — Encounter
Admission: RE | Admit: 2020-03-15 | Discharge: 2020-03-15 | Disposition: A | Discharge status: Home / Self Care | Payer: PRIVATE HEALTH INSURANCE | Source: Ambulatory Visit | Attending: Surgery | Admitting: Surgery

## 2020-03-15 ENCOUNTER — Other Ambulatory Visit: Payer: PRIVATE HEALTH INSURANCE

## 2020-03-15 DIAGNOSIS — Z01812 Encounter for preprocedural laboratory examination: Secondary | ICD-10-CM | POA: Diagnosis not present

## 2020-03-15 LAB — POTASSIUM: Potassium: 3.7 mmol/L (ref 3.5–5.1)

## 2020-03-16 ENCOUNTER — Ambulatory Visit: Payer: No Typology Code available for payment source | Admitting: Certified Registered Nurse Anesthetist

## 2020-03-16 ENCOUNTER — Other Ambulatory Visit: Payer: Self-pay

## 2020-03-16 ENCOUNTER — Ambulatory Visit
Admission: RE | Admit: 2020-03-16 | Discharge: 2020-03-16 | Disposition: A | Discharge status: Home / Self Care | Payer: No Typology Code available for payment source | Attending: Surgery | Admitting: Surgery

## 2020-03-16 ENCOUNTER — Encounter
Admission: RE | Disposition: A | Discharge status: Home / Self Care | Payer: Self-pay | Source: Home / Self Care | Attending: Surgery

## 2020-03-16 DIAGNOSIS — K8020 Calculus of gallbladder without cholecystitis without obstruction: Secondary | ICD-10-CM | POA: Diagnosis present

## 2020-03-16 DIAGNOSIS — K8012 Calculus of gallbladder with acute and chronic cholecystitis without obstruction: Secondary | ICD-10-CM | POA: Diagnosis not present

## 2020-03-16 LAB — SARS CORONAVIRUS 2 (TAT 6-24 HRS): SARS Coronavirus 2: NEGATIVE

## 2020-03-16 LAB — POCT PREGNANCY, URINE: Preg Test, Ur: NEGATIVE

## 2020-03-16 SURGERY — CHOLECYSTECTOMY, ROBOT-ASSISTED, LAPAROSCOPIC
Anesthesia: General | Site: Abdomen

## 2020-03-16 MED ORDER — MIDAZOLAM HCL 2 MG/2ML IJ SOLN
INTRAMUSCULAR | Status: AC
  Filled 2020-03-16: qty 2

## 2020-03-16 MED ORDER — CHLORHEXIDINE GLUCONATE CLOTH 2 % EX PADS: 6.0000 | MEDICATED_PAD | Freq: Once | CUTANEOUS | Status: DC

## 2020-03-16 MED ORDER — OXYCODONE HCL 5 MG PO TABS
5.0000 mg | ORAL_TABLET | Freq: Once | ORAL | Status: AC | PRN
  Administered 2020-03-16: 5 mg via ORAL

## 2020-03-16 MED ORDER — FAMOTIDINE 20 MG PO TABS: 20.0000 mg | ORAL_TABLET | Freq: Once | ORAL | Status: AC

## 2020-03-16 MED ORDER — IBUPROFEN 600 MG PO TABS: 600.0000 mg | ORAL_TABLET | Freq: Three times a day (TID) | ORAL | 1 refills | Status: AC | PRN

## 2020-03-16 MED ORDER — ACETAMINOPHEN 500 MG PO TABS
ORAL_TABLET | ORAL | Status: AC
  Administered 2020-03-16: 1000 mg via ORAL
  Filled 2020-03-16: qty 2

## 2020-03-16 MED ORDER — ORAL CARE MOUTH RINSE: 15.0000 mL | Freq: Once | OROMUCOSAL | Status: AC

## 2020-03-16 MED ORDER — DEXMEDETOMIDINE (PRECEDEX) IN NS 20 MCG/5ML (4 MCG/ML) IV SYRINGE
PREFILLED_SYRINGE | INTRAVENOUS | Status: AC
  Filled 2020-03-16: qty 5

## 2020-03-16 MED ORDER — FENTANYL CITRATE (PF) 100 MCG/2ML IJ SOLN
INTRAMUSCULAR | Status: AC
  Filled 2020-03-16: qty 2

## 2020-03-16 MED ORDER — MIDAZOLAM HCL 2 MG/2ML IJ SOLN
INTRAMUSCULAR | Status: DC | PRN
  Administered 2020-03-16: 2 mg via INTRAVENOUS

## 2020-03-16 MED ORDER — PROPOFOL 10 MG/ML IV BOLUS
INTRAVENOUS | Status: DC | PRN
  Administered 2020-03-16: 150 mg via INTRAVENOUS

## 2020-03-16 MED ORDER — LACTATED RINGERS IV SOLN: INTRAVENOUS | Status: DC

## 2020-03-16 MED ORDER — PHENYLEPHRINE HCL (PRESSORS) 10 MG/ML IV SOLN
INTRAVENOUS | Status: DC | PRN
  Administered 2020-03-16: 100 ug via INTRAVENOUS
  Administered 2020-03-16: 50 ug via INTRAVENOUS
  Administered 2020-03-16 (×2): 100 ug via INTRAVENOUS
  Administered 2020-03-16: 50 ug via INTRAVENOUS
  Administered 2020-03-16 (×2): 100 ug via INTRAVENOUS
  Administered 2020-03-16 (×2): 50 ug via INTRAVENOUS
  Administered 2020-03-16: 100 ug via INTRAVENOUS

## 2020-03-16 MED ORDER — OXYCODONE HCL 5 MG PO TABS: 5.0000 mg | ORAL_TABLET | ORAL | 0 refills | Status: DC | PRN

## 2020-03-16 MED ORDER — PROPOFOL 10 MG/ML IV BOLUS
INTRAVENOUS | Status: AC
  Filled 2020-03-16: qty 20

## 2020-03-16 MED ORDER — BUPIVACAINE-EPINEPHRINE (PF) 0.25% -1:200000 IJ SOLN
INTRAMUSCULAR | Status: AC
  Filled 2020-03-16: qty 30

## 2020-03-16 MED ORDER — INDOCYANINE GREEN 25 MG IV SOLR
2.5000 mg | INTRAVENOUS | Status: AC
  Administered 2020-03-16: 2.5 mg via INTRAVENOUS
  Filled 2020-03-16: qty 1

## 2020-03-16 MED ORDER — DEXAMETHASONE SODIUM PHOSPHATE 10 MG/ML IJ SOLN
INTRAMUSCULAR | Status: AC
  Filled 2020-03-16: qty 1

## 2020-03-16 MED ORDER — ACETAMINOPHEN 500 MG PO TABS: 1000.0000 mg | ORAL_TABLET | Freq: Four times a day (QID) | ORAL | Status: AC | PRN

## 2020-03-16 MED ORDER — KETOROLAC TROMETHAMINE 30 MG/ML IJ SOLN
INTRAMUSCULAR | Status: AC
  Filled 2020-03-16: qty 1

## 2020-03-16 MED ORDER — ROCURONIUM BROMIDE 100 MG/10ML IV SOLN
INTRAVENOUS | Status: DC | PRN
  Administered 2020-03-16: 20 mg via INTRAVENOUS
  Administered 2020-03-16: 40 mg via INTRAVENOUS
  Administered 2020-03-16: 10 mg via INTRAVENOUS

## 2020-03-16 MED ORDER — GABAPENTIN 300 MG PO CAPS: 300.0000 mg | ORAL_CAPSULE | ORAL | Status: AC

## 2020-03-16 MED ORDER — FAMOTIDINE 20 MG PO TABS
ORAL_TABLET | ORAL | Status: AC
  Administered 2020-03-16: 20 mg via ORAL
  Filled 2020-03-16: qty 1

## 2020-03-16 MED ORDER — ONDANSETRON HCL 4 MG/2ML IJ SOLN: 4.0000 mg | Freq: Once | INTRAMUSCULAR | Status: DC | PRN

## 2020-03-16 MED ORDER — CEFAZOLIN SODIUM-DEXTROSE 2-4 GM/100ML-% IV SOLN
2.0000 g | INTRAVENOUS | Status: AC
  Administered 2020-03-16: 2 g via INTRAVENOUS

## 2020-03-16 MED ORDER — BUPIVACAINE LIPOSOME 1.3 % IJ SUSP
INTRAMUSCULAR | Status: AC
  Filled 2020-03-16: qty 20

## 2020-03-16 MED ORDER — GABAPENTIN 300 MG PO CAPS
ORAL_CAPSULE | ORAL | Status: AC
  Administered 2020-03-16: 300 mg via ORAL
  Filled 2020-03-16: qty 1

## 2020-03-16 MED ORDER — OXYCODONE HCL 5 MG PO TABS
ORAL_TABLET | ORAL | Status: AC
  Filled 2020-03-16: qty 1

## 2020-03-16 MED ORDER — FENTANYL CITRATE (PF) 100 MCG/2ML IJ SOLN
25.0000 ug | INTRAMUSCULAR | Status: DC | PRN
  Administered 2020-03-16 (×2): 25 ug via INTRAVENOUS

## 2020-03-16 MED ORDER — OXYCODONE HCL 5 MG/5ML PO SOLN: 5.0000 mg | Freq: Once | ORAL | Status: AC | PRN

## 2020-03-16 MED ORDER — BUPIVACAINE-EPINEPHRINE (PF) 0.25% -1:200000 IJ SOLN
INTRAMUSCULAR | Status: DC | PRN
  Administered 2020-03-16: 30 mL

## 2020-03-16 MED ORDER — SUGAMMADEX SODIUM 200 MG/2ML IV SOLN
INTRAVENOUS | Status: DC | PRN
  Administered 2020-03-16: 200 mg via INTRAVENOUS

## 2020-03-16 MED ORDER — CEFAZOLIN SODIUM-DEXTROSE 2-4 GM/100ML-% IV SOLN
INTRAVENOUS | Status: AC
  Filled 2020-03-16: qty 100

## 2020-03-16 MED ORDER — CHLORHEXIDINE GLUCONATE 0.12 % MT SOLN: 15.0000 mL | Freq: Once | OROMUCOSAL | Status: AC

## 2020-03-16 MED ORDER — DEXMEDETOMIDINE HCL 200 MCG/2ML IV SOLN
INTRAVENOUS | Status: DC | PRN
  Administered 2020-03-16: 8 ug via INTRAVENOUS
  Administered 2020-03-16: 4 ug via INTRAVENOUS

## 2020-03-16 MED ORDER — FENTANYL CITRATE (PF) 100 MCG/2ML IJ SOLN
INTRAMUSCULAR | Status: DC | PRN
  Administered 2020-03-16: 50 ug via INTRAVENOUS
  Administered 2020-03-16: 100 ug via INTRAVENOUS
  Administered 2020-03-16: 50 ug via INTRAVENOUS

## 2020-03-16 MED ORDER — LIDOCAINE HCL (CARDIAC) PF 100 MG/5ML IV SOSY
PREFILLED_SYRINGE | INTRAVENOUS | Status: DC | PRN
  Administered 2020-03-16: 60 mg via INTRAVENOUS

## 2020-03-16 MED ORDER — LIDOCAINE HCL (PF) 2 % IJ SOLN
INTRAMUSCULAR | Status: AC
  Filled 2020-03-16: qty 5

## 2020-03-16 MED ORDER — CHLORHEXIDINE GLUCONATE 0.12 % MT SOLN
OROMUCOSAL | Status: AC
  Administered 2020-03-16: 15 mL via OROMUCOSAL
  Filled 2020-03-16: qty 15

## 2020-03-16 MED ORDER — ACETAMINOPHEN 500 MG PO TABS: 1000.0000 mg | ORAL_TABLET | ORAL | Status: AC

## 2020-03-16 MED ORDER — ONDANSETRON HCL 4 MG/2ML IJ SOLN
INTRAMUSCULAR | Status: DC | PRN
  Administered 2020-03-16: 4 mg via INTRAVENOUS

## 2020-03-16 MED ORDER — DEXAMETHASONE SODIUM PHOSPHATE 10 MG/ML IJ SOLN
INTRAMUSCULAR | Status: DC | PRN
  Administered 2020-03-16: 10 mg via INTRAVENOUS

## 2020-03-16 MED ORDER — ONDANSETRON HCL 4 MG/2ML IJ SOLN
INTRAMUSCULAR | Status: AC
  Filled 2020-03-16: qty 2

## 2020-03-16 MED ORDER — ACETAMINOPHEN 500 MG PO TABS
ORAL_TABLET | ORAL | Status: AC
  Filled 2020-03-16: qty 2

## 2020-03-16 SURGICAL SUPPLY — 69 items
ADH SKN CLS APL DERMABOND .7 (GAUZE/BANDAGES/DRESSINGS) ×1
APL PRP STRL LF DISP 70% ISPRP (MISCELLANEOUS) ×1
BAG INFUSER PRESSURE 100CC (MISCELLANEOUS) ×2 IMPLANT
BAG SPEC RTRVL LRG 6X4 10 (ENDOMECHANICALS) ×1
BULB RESERV EVAC DRAIN JP 100C (MISCELLANEOUS) ×2 IMPLANT
CANISTER SUCT 1200ML W/VALVE (MISCELLANEOUS) IMPLANT
CANNULA REDUC XI 12-8 STAPL (CANNULA) ×1
CANNULA REDUCER 12-8 DVNC XI (CANNULA) ×1 IMPLANT
CHLORAPREP W/TINT 26 (MISCELLANEOUS) ×2 IMPLANT
CLIP VESOLOCK MED LG 6/CT (CLIP) ×4 IMPLANT
COVER TIP SHEARS 8 DVNC (MISCELLANEOUS) ×1 IMPLANT
COVER TIP SHEARS 8MM DA VINCI (MISCELLANEOUS) ×1
COVER WAND RF STERILE (DRAPES) IMPLANT
CUP MEDICINE 2OZ PLAST GRAD ST (MISCELLANEOUS) ×2 IMPLANT
DECANTER SPIKE VIAL GLASS SM (MISCELLANEOUS) IMPLANT
DEFOGGER SCOPE WARMER CLEARIFY (MISCELLANEOUS) ×2 IMPLANT
DERMABOND ADVANCED (GAUZE/BANDAGES/DRESSINGS) ×1
DERMABOND ADVANCED .7 DNX12 (GAUZE/BANDAGES/DRESSINGS) ×1 IMPLANT
DRAIN CHANNEL JP 19F (MISCELLANEOUS) ×2 IMPLANT
DRAPE ARM DVNC X/XI (DISPOSABLE) ×4 IMPLANT
DRAPE COLUMN DVNC XI (DISPOSABLE) ×1 IMPLANT
DRAPE DA VINCI XI ARM (DISPOSABLE) ×4
DRAPE DA VINCI XI COLUMN (DISPOSABLE) ×1
DRSG TEGADERM 4X4.75 (GAUZE/BANDAGES/DRESSINGS) ×2 IMPLANT
ELECT CAUTERY BLADE TIP 2.5 (TIP) ×2
ELECT REM PT RETURN 9FT ADLT (ELECTROSURGICAL) ×2
ELECTRODE CAUTERY BLDE TIP 2.5 (TIP) ×1 IMPLANT
ELECTRODE REM PT RTRN 9FT ADLT (ELECTROSURGICAL) ×1 IMPLANT
GAUZE SPONGE 4X4 12PLY STRL (GAUZE/BANDAGES/DRESSINGS) ×2 IMPLANT
GLOVE SURG SYN 7.0 (GLOVE) ×4 IMPLANT
GLOVE SURG SYN 7.5  E (GLOVE) ×2
GLOVE SURG SYN 7.5 E (GLOVE) ×2 IMPLANT
GOWN STRL REUS W/ TWL LRG LVL3 (GOWN DISPOSABLE) ×4 IMPLANT
GOWN STRL REUS W/TWL LRG LVL3 (GOWN DISPOSABLE) ×8
IRRIGATOR SUCT 8 DISP DVNC XI (IRRIGATION / IRRIGATOR) ×1 IMPLANT
IRRIGATOR SUCTION 8MM XI DISP (IRRIGATION / IRRIGATOR) ×1
IV NS 1000ML (IV SOLUTION) ×2
IV NS 1000ML BAXH (IV SOLUTION) ×1 IMPLANT
KIT PINK PAD W/HEAD ARE REST (MISCELLANEOUS) ×2
KIT PINK PAD W/HEAD ARM REST (MISCELLANEOUS) ×1 IMPLANT
LABEL OR SOLS (LABEL) ×2 IMPLANT
MANIFOLD NEPTUNE II (INSTRUMENTS) ×2 IMPLANT
NEEDLE HYPO 22GX1.5 SAFETY (NEEDLE) ×2 IMPLANT
NS IRRIG 500ML POUR BTL (IV SOLUTION) ×2 IMPLANT
OBTURATOR OPTICAL STANDARD 8MM (TROCAR) ×1
OBTURATOR OPTICAL STND 8 DVNC (TROCAR) ×1
OBTURATOR OPTICALSTD 8 DVNC (TROCAR) ×1 IMPLANT
PACK LAP CHOLECYSTECTOMY (MISCELLANEOUS) ×2 IMPLANT
PENCIL ELECTRO HAND CTR (MISCELLANEOUS) ×2 IMPLANT
POUCH SPECIMEN RETRIEVAL 10MM (ENDOMECHANICALS) ×2 IMPLANT
RELOAD STAPLER 3.5X45 BLU DVNC (STAPLE) ×1 IMPLANT
SEAL CANN UNIV 5-8 DVNC XI (MISCELLANEOUS) ×4 IMPLANT
SEAL XI 5MM-8MM UNIVERSAL (MISCELLANEOUS) ×4
SET TUBE SMOKE EVAC HIGH FLOW (TUBING) ×2 IMPLANT
SOLUTION ELECTROLUBE (MISCELLANEOUS) ×2 IMPLANT
SPONGE LAP 18X18 RF (DISPOSABLE) IMPLANT
SPONGE LAP 4X18 RFD (DISPOSABLE) IMPLANT
STAPLER 45 DA VINCI SURE FORM (STAPLE) ×1
STAPLER 45 SUREFORM DVNC (STAPLE) ×1 IMPLANT
STAPLER CANNULA SEAL DVNC XI (STAPLE) ×1 IMPLANT
STAPLER CANNULA SEAL XI (STAPLE) ×1
STAPLER RELOAD 3.5X45 BLU DVNC (STAPLE) ×1
STAPLER RELOAD 3.5X45 BLUE (STAPLE) ×1
SUT ETHILON 3-0 FS-10 30 BLK (SUTURE) ×2
SUT MNCRL AB 4-0 PS2 18 (SUTURE) ×2 IMPLANT
SUT VICRYL 0 AB UR-6 (SUTURE) ×4 IMPLANT
SUTURE EHLN 3-0 FS-10 30 BLK (SUTURE) ×1 IMPLANT
TAPE TRANSPORE STRL 2 31045 (GAUZE/BANDAGES/DRESSINGS) ×2 IMPLANT
TROCAR BALLN GELPORT 12X130M (ENDOMECHANICALS) ×2 IMPLANT

## 2020-03-16 NOTE — Anesthesia Preprocedure Evaluation (Signed)
Anesthesia Evaluation  Patient identified by MRN, date of birth, ID band Patient awake  General Assessment Comment:gallstones  Reviewed: Allergy & Precautions, NPO status , Patient's Chart, lab work & pertinent test results  History of Anesthesia Complications Negative for: history of anesthetic complications  Airway Mallampati: II  TM Distance: >3 FB Neck ROM: Full    Dental no notable dental hx. (+) Teeth Intact   Pulmonary neg pulmonary ROS, neg sleep apnea, neg COPD, Patient abstained from smoking.Not current smoker,    Pulmonary exam normal breath sounds clear to auscultation       Cardiovascular Exercise Tolerance: Good METS(-) hypertension(-) CAD and (-) Past MI negative cardio ROS  (-) dysrhythmias  Rhythm:Regular Rate:Normal - Systolic murmurs    Neuro/Psych negative neurological ROS  negative psych ROS   GI/Hepatic neg GERD  ,(+)     (-) substance abuse  ,   Endo/Other  neg diabetes  Renal/GU negative Renal ROS     Musculoskeletal   Abdominal   Peds  Hematology   Anesthesia Other Findings Past Medical History: No date: Medical history non-contributory  Reproductive/Obstetrics                             Anesthesia Physical Anesthesia Plan  ASA: I  Anesthesia Plan: General   Post-op Pain Management:    Induction: Intravenous  PONV Risk Score and Plan: 4 or greater and Ondansetron, Dexamethasone and Midazolam  Airway Management Planned: Oral ETT  Additional Equipment: None  Intra-op Plan:   Post-operative Plan: Extubation in OR  Informed Consent: I have reviewed the patients History and Physical, chart, labs and discussed the procedure including the risks, benefits and alternatives for the proposed anesthesia with the patient or authorized representative who has indicated his/her understanding and acceptance.     Dental advisory given  Plan Discussed with:  CRNA and Surgeon  Anesthesia Plan Comments: (Discussed risks of anesthesia with patient, including PONV, sore throat, lip/dental damage. Rare risks discussed as well, such as cardiorespiratory and neurological sequelae. Patient understands.)        Anesthesia Quick Evaluation

## 2020-03-16 NOTE — Anesthesia Postprocedure Evaluation (Signed)
Anesthesia Post Note  Patient: Celene Squibb  Procedure(s) Performed: XI ROBOTIC ASSISTED LAPAROSCOPIC CHOLECYSTECTOMY (N/A Abdomen) INDOCYANINE GREEN FLUORESCENCE IMAGING (ICG) (N/A Abdomen)  Patient location during evaluation: PACU Anesthesia Type: General Level of consciousness: awake and alert Pain management: pain level controlled Vital Signs Assessment: post-procedure vital signs reviewed and stable Respiratory status: spontaneous breathing, nonlabored ventilation, respiratory function stable and patient connected to nasal cannula oxygen Cardiovascular status: blood pressure returned to baseline and stable Postop Assessment: no apparent nausea or vomiting Anesthetic complications: no   No complications documented.   Last Vitals:  Vitals:   03/16/20 1315 03/16/20 1330  BP: (!) 106/56 116/63  Pulse: 88 91  Resp: 17 20  Temp:  (!) 36.4 C  SpO2: 99% 100%    Last Pain:  Vitals:   03/16/20 1330  TempSrc:   PainSc: 3                  Corinda Gubler

## 2020-03-16 NOTE — Discharge Instructions (Signed)
Cholelithiasis  Cholelithiasis is a disease in which gallstones form in the gallbladder. The gallbladder is an organ that stores bile. Bile is a fluid that helps to digest fats. Gallstones begin as small crystals and can slowly grow into stones. They may cause no symptoms until they block the gallbladder duct, or cystic duct, when the gallbladder tightens (contracts) after food is eaten. This can cause pain and is known as a gallbladder attack, or biliary colic. There are two main types of gallstones:  Cholesterol stones. These are the most common type of gallstone. These stones are made of hardened cholesterol and are usually yellow-green in color. Cholesterol is a fat-like substance that is made in the liver.  Pigment stones. These are dark in color and are made of a red-yellow substance, called bilirubin,that forms when hemoglobin from red blood cells breaks down. What are the causes? This condition may be caused by an imbalance in the different parts that make bile. This can happen if the bile:  Has too much bilirubin. This can happen in certain blood diseases, such as sickle cell anemia.  Has too much cholesterol.  Does not have enough bile salts. These salts help the body absorb and digest fats. In some cases, this condition can also be caused by the gallbladder not emptying completely or often enough. This is common during pregnancy. What increases the risk? The following factors may make you more likely to develop this condition:  Being female.  Having multiple pregnancies. Health care providers sometimes advise removing diseased gallbladders before future pregnancies.  Eating a diet that is heavy in fried foods, fat, and refined carbohydrates, such as white bread and white rice.  Being obese.  Being older than age 40.  Using medicines that contain female hormones (estrogen) for a long time.  Losing weight quickly.  Having a family history of gallstones.  Having certain  medical problems, such as: ? Diabetes mellitus. ? Cystic fibrosis. ? Crohn's disease. ? Cirrhosis or other long-term (chronic) liver disease. ? Certain blood diseases, such as sickle cell anemia or leukemia. What are the signs or symptoms? In many cases, having gallstones causes no symptoms. When you have gallstones but do not have symptoms, you have silent gallstones. If a gallstone blocks your bile duct, it can cause a gallbladder attack. The main symptom of a gallbladder attack is sudden pain in the upper right part of the abdomen. The pain:  Usually comes at night or after eating.  Can last for one hour or more.  Can spread to your right shoulder, back, or chest.  Can feel like indigestion. This is discomfort, burning, or fullness in your upper abdomen. If the bile duct is blocked for more than a few hours, it can cause an infection or inflammation of your gallbladder (cholecystitis), liver, or pancreas. This can cause:  Nausea or vomiting.  Bloating.  Pain in your abdomen that lasts for 5 hours or longer.  Tenderness in your upper abdomen, often in the upper right section and under your rib cage.  Fever or chills.  Skin or the white parts of your eyes turning yellow (jaundice). This usually happens when a stone has blocked bile from passing through the common bile duct.  Dark urine or light-colored stools. How is this diagnosed? This condition may be diagnosed based on:  A physical exam.  Your medical history.  Ultrasound.  CT scan.  MRI. You may also have other tests, including:  Blood tests to check for signs of an   infection or inflammation.  Cholescintigraphy, or HIDA scan. This is a scan of your gallbladder and bile ducts (biliary system) using non-harmful radioactive material and special cameras that can see the radioactive material.  Endoscopic retrograde cholangiopancreatogram. This involves inserting a small tube with a camera on the end (endoscope)  through your mouth to look at bile ducts and check for blockages. How is this treated? Treatment for this condition depends on the severity of the condition. Silent gallstones do not need treatment. Treatment may be needed if a blockage causes a gallbladder attack or other symptoms. Treatment may include:  Home care, if symptoms are not severe. ? During a simple gallbladder attack, stop eating and drinking for 12-24 hours (except for water and clear liquids). This helps to "cool down" your gallbladder. After 1 or 2 days, you can start to eat a diet of simple or clear foods, such as broths and crackers. ? You may also need medicines for pain or nausea or both. ? If you have cholecystitis and an infection, you will need antibiotics.  A hospital stay, if needed for pain control or for cholecystitis with severe infection.  Cholecystectomy, or surgery to remove your gallbladder. This is the most common treatment if all other treatments have not worked.  Medicines to break up gallstones. These are most effective at treating small gallstones. Medicines may be used for up to 6-12 months.  Endoscopic retrograde cholangiopancreatogram. A small basket can be attached to the endoscope and used to capture and remove gallstones, mainly those that are in the common bile duct. Follow these instructions at home: Medicines  Take over-the-counter and prescription medicines only as told by your health care provider.  If you were prescribed an antibiotic medicine, take it as told by your health care provider. Do not stop taking the antibiotic even if you start to feel better.  Ask your health care provider if the medicine prescribed to you requires you to avoid driving or using machinery. Eating and drinking  Drink enough fluid to keep your urine pale yellow. This is important during a gallbladder attack. Water and clear liquids are preferred.  Follow a healthy diet. This includes: ? Reducing fatty foods,  such as fried food and foods high in cholesterol. ? Reducing refined carbohydrates, such as white bread and white rice. ? Eating more fiber. Aim for foods such as almonds, fruit, and beans. Alcohol use  If you drink alcohol: ? Limit how much you use to:  0-1 drink a day for nonpregnant women.  0-2 drinks a day for men. ? Be aware of how much alcohol is in your drink. In the [place] one drink equals one 12 oz bottle of beer (355 mL), one 5 oz glass of wine (148 mL), or one 1 oz glass of hard liquor (44 mL). General instructions  Do not use any products that contain nicotine or tobacco, such as cigarettes, e-cigarettes, and chewing tobacco. If you need help quitting, ask your health care provider.  Maintain a healthy weight.  Keep all follow-up visits as told by your health care provider. These may include consultations with a surgeon or specialist. This is important. Where to find more information  National Institute of Diabetes and Digestive and Kidney Diseases: www.niddk.nih.gov Contact a health care provider if:  You think you have had a gallbladder attack.  You have been diagnosed with silent gallstones and you develop pain in your abdomen or indigestion.  You begin to have attacks more often.  You have   dark urine or light-colored stools. Get help right away if:  You have pain from a gallbladder attack that lasts for more than 2 hours.  You have pain in your abdomen that lasts for more than 5 hours or is getting worse.  You have a fever or chills.  You have nausea and vomiting that do not go away.  You develop jaundice. Summary  Cholelithiasis is a disease in which gallstones form in the gallbladder.  This condition may be caused by an imbalance in the different parts that make bile. This can happen if your bile has too much bilirubin or cholesterol, or does not have enough bile salts.  Treatment for gallstones depends on the severity of the condition. Silent  gallstones do not need treatment.  If gallstones cause a gallbladder attack or other symptoms, treatment usually involves not eating or drinking anything. Treatment may also include pain medicines and antibiotics, and it sometimes includes a hospital stay.  Surgery to remove the gallbladder is common if all other treatments have not worked. This information is not intended to replace advice given to you by your health care provider. Make sure you discuss any questions you have with your health care provider. Document Revised: 12/16/2018 Document Reviewed: 12/16/2018 Elsevier Patient Education  2021 [employer]  

## 2020-03-16 NOTE — Transfer of Care (Signed)
Immediate Anesthesia Transfer of Care Note  Patient: Melissa Macdonald  Procedure(s) Performed: XI ROBOTIC ASSISTED LAPAROSCOPIC CHOLECYSTECTOMY (N/A Abdomen) INDOCYANINE GREEN FLUORESCENCE IMAGING (ICG) (N/A Abdomen)  Patient Location: PACU  Anesthesia Type:General  Level of Consciousness: awake, alert  and oriented  Airway & Oxygen Therapy: Patient Spontanous Breathing and Patient connected to face mask oxygen  Post-op Assessment: Report given to RN and Post -op Vital signs reviewed and stable  Post vital signs: Reviewed and stable  Last Vitals:  Vitals Value Taken Time  BP 99/56 03/16/20 1250  Temp    Pulse 87 03/16/20 1254  Resp 20 03/16/20 1254  SpO2 99 % 03/16/20 1254  Vitals shown include unvalidated device data.  Last Pain:  Vitals:   03/16/20 0817  TempSrc: Oral  PainSc: 6          Complications: No complications documented.

## 2020-03-16 NOTE — Op Note (Signed)
Procedure Date:  03/16/2020  Pre-operative Diagnosis:  Symptomatic cholelithiasis  Post-operative Diagnosis:  Acute cholecystitis  Procedure:  Robotic assisted cholecystectomy with ICG FireFly cholangiogram  Surgeon:  Howie Ill, MD  Assistant:  Quita Skye, PA-S  Anesthesia:  General endotracheal  Estimated Blood Loss:  30 ml  Specimens:  gallbladder  Complications:  None  Indications for Procedure:  This is a 25 y.o. female who presents with abdominal pain and workup revealing symptomatic cholelithiasis.  The benefits, complications, treatment options, and expected outcomes were discussed with the patient. The risks of bleeding, infection, recurrence of symptoms, failure to resolve symptoms, bile duct damage, bile duct leak, retained common bile duct stone, bowel injury, and need for further procedures were all discussed with the patient and she was willing to proceed.  Description of Procedure: The patient was correctly identified in the preoperative area and brought into the operating room.  The patient was placed supine with VTE prophylaxis in place.  Appropriate time-outs were performed.  Anesthesia was induced and the patient was intubated.  Appropriate antibiotics were infused.  The abdomen was prepped and draped in a sterile fashion. An infraumbilical incision was made. A cutdown technique was used to enter the abdominal cavity without injury, and a 12 mm robotic port was inserted.  Pneumoperitoneum was obtained with appropriate opening pressures.  Three 8-mm ports were placed in the mid abdomen at the level of the umbilicus under direct visualization.  The DaVinci platform was docked, camera targeted, and instruments were placed under direct visualization.  The gallbladder was identified.  It was very edematous and distended, more consistent with acute on chronic cholecystitis.  Started with laparoscopic needle decompression in order to be able to grasp the gallbladder.   The fundus was grasped and retracted cephalad.  Adhesions were lysed bluntly and with electrocautery. The infundibulum was grasped and retracted laterally, exposing the peritoneum overlying the gallbladder.  This was incised with electrocautery and extended on either side of the gallbladder.  FireFly cholangiogram was then obtained, and we were able to clearly identify the common bile duct, but the cystic duct was not visible, also consistent with cholecystitis.  The neck of the gallbladder and cystic artery were carefully dissected with combination of cautery and blunt dissection.  The artery was clipped twice proximally and once distally, cutting in between.  This allowed for better exposure of the cystic duct, which was surrounded by a thick inflammatory response. After further dissection, it was noted that the cystic duct had a tear from the dissection, allowing gallstones to leak through.  At that point, I decided that the cystic duct would be stapled instead.  I tried dissecting distally more to allow for the stapler to fit well under the tear.  The stapler was introduced, and a 45 mm blue load was stapled across the distal cystic duct, distal to the tear, and without any involvement of the common bile duct.  FireFly cholangiogram again confirmed the common bile duct being intact and away from the staple line.  The gallbladder was then taken from the gallbladder fossa in a retrograde fashion with electrocautery. The gallbladder was placed in an Endocatch bag. The liver bed was inspected and any bleeding was controlled with electrocautery. The right upper quadrant was then inspected again revealing intact staple line, no bleeding, and no ductal injury.  The area was thoroughly irrigated.  The DaVinci platform was undocked at this point.  A 19 Fr. Blake drain was inserted via the right lateral  port towards the gallbladder fossa in the RUQ.  The 8 mm ports were removed under direct visualization and the 12 mm  port was removed.  The Endocatch bag was brought out via the umbilical incision. The fascial opening was closed using 0 vicryl sutures.  Local anesthetic was infused in all incisions and the incisions were closed with 4-0 Monocryl.  The drain was secured using 3-0 Nylon.  The wounds were cleaned and sealed with DermaBond.  The drain was dressed with 4x4 gauze and TegaDerm.  The patient was emerged from anesthesia and extubated and brought to the recovery room for further management.  The patient tolerated the procedure well and all counts were correct at the end of the case.   Howie Ill, MD

## 2020-03-16 NOTE — Anesthesia Procedure Notes (Signed)
Procedure Name: Intubation Date/Time: 03/16/2020 9:39 AM Performed by: Tollie Eth, CRNA Pre-anesthesia Checklist: Patient identified, Patient being monitored, Timeout performed, Emergency Drugs available and Suction available Patient Re-evaluated:Patient Re-evaluated prior to induction Oxygen Delivery Method: Circle system utilized Preoxygenation: Pre-oxygenation with 100% oxygen Induction Type: IV induction Ventilation: Mask ventilation without difficulty Laryngoscope Size: Mac and 3 Grade View: Grade I Tube type: Oral Tube size: 6.5 mm Number of attempts: 1 Airway Equipment and Method: Stylet Placement Confirmation: ETT inserted through vocal cords under direct vision,  positive ETCO2 and breath sounds checked- equal and bilateral Secured at: 21 cm Tube secured with: Tape Dental Injury: Teeth and Oropharynx as per pre-operative assessment

## 2020-03-16 NOTE — Interval H&P Note (Signed)
History and Physical Interval Note:  03/16/2020 9:16 AM  Melissa Macdonald  has presented today for surgery, with the diagnosis of symptomatic cholelithiasis.  The various methods of treatment have been discussed with the patient and family. After consideration of risks, benefits and other options for treatment, the patient has consented to  Procedure(s): XI ROBOTIC ASSISTED LAPAROSCOPIC CHOLECYSTECTOMY (N/A) INDOCYANINE GREEN FLUORESCENCE IMAGING (ICG) (N/A) as a surgical intervention.  The patient's history has been reviewed, patient examined, no change in status, stable for surgery.  I have reviewed the patient's chart and labs.  Questions were answered to the patient's satisfaction.     Zarina Pe

## 2020-03-17 LAB — SURGICAL PATHOLOGY

## 2020-03-22 ENCOUNTER — Encounter: Payer: Self-pay | Admitting: Surgery

## 2020-03-22 ENCOUNTER — Other Ambulatory Visit: Payer: Self-pay

## 2020-03-22 ENCOUNTER — Ambulatory Visit (INDEPENDENT_AMBULATORY_CARE_PROVIDER_SITE_OTHER): Payer: PRIVATE HEALTH INSURANCE | Admitting: Surgery

## 2020-03-22 VITALS — BP 103/71 | HR 71 | Temp 98.8°F | Ht 62.0 in | Wt 135.6 lb

## 2020-03-22 DIAGNOSIS — K8000 Calculus of gallbladder with acute cholecystitis without obstruction: Secondary | ICD-10-CM

## 2020-03-22 DIAGNOSIS — Z09 Encounter for follow-up examination after completed treatment for conditions other than malignant neoplasm: Secondary | ICD-10-CM

## 2020-03-22 NOTE — Patient Instructions (Addendum)
You may shower tomorrow. Remove your dressing and let the soapy water run over the area, rinse, and pat dry. Place a dry dressing over the area and change this once daily as long as the area is open. You may eat what ever you like. Just add foods in gradually. Follow up in 2 weeks.   GENERAL POST-OPERATIVE PATIENT INSTRUCTIONS   WOUND CARE INSTRUCTIONS:  Keep a dry clean dressing on the wound if there is drainage. The initial bandage may be removed after 24 hours.  Once the wound has quit draining you may leave it open to air.  If clothing rubs against the wound or causes irritation and the wound is not draining you may cover it with a dry dressing during the daytime.  Try to keep the wound dry and avoid ointments on the wound unless directed to do so.  If the wound becomes bright red and painful or starts to drain infected material that is not clear, please contact your physician immediately.  If the wound is mildly pink and has a thick firm ridge underneath it, this is normal, and is referred to as a healing ridge.  This will resolve over the next 4-6 weeks.  BATHING: You may shower if you have been informed of this by your surgeon. However, Please do not submerge in a tub, hot tub, or pool until incisions are completely sealed or have been told by your surgeon that you may do so.  DIET:  You may eat any foods that you can tolerate.  It is a good idea to eat a high fiber diet and take in plenty of fluids to prevent constipation.  If you do become constipated you may want to take a mild laxative or take ducolax tablets on a daily basis until your bowel habits are regular.  Constipation can be very uncomfortable, along with straining, after recent surgery.  ACTIVITY:  You are encouraged to cough and deep breath or use your incentive spirometer if you were given one, every 15-30 minutes when awake.  This will help prevent respiratory complications and low grade fevers post-operatively if you had a general  anesthetic.  You may want to hug a pillow when coughing and sneezing to add additional support to the surgical area, if you had abdominal or chest surgery, which will decrease pain during these times.  You are encouraged to walk and engage in light activity for the next two weeks. You should not lift more than 20 pounds for 4-6 weeks after surger as it could put you at increased risk for complications. Twenty pounds is roughly equivalent to a plastic bag of groceries. At that time- Listen to your body when lifting, if you have pain when lifting, stop and then try again in a few days. Soreness after doing exercises or activities of daily living is normal as you get back in to your normal routine.  MEDICATIONS:  Try to take narcotic medications and anti-inflammatory medications, such as tylenol, ibuprofen, naprosyn, etc., with food.  This will minimize stomach upset from the medication.  Should you develop nausea and vomiting from the pain medication, or develop a rash, please discontinue the medication and contact your physician.  You should not drive, make important decisions, or operate machinery when taking narcotic pain medication.  SUNBLOCK Use sun block to incision area over the next year if this area will be exposed to sun. This helps decrease scarring and will allow you avoid a permanent darkened area over your incision.  QUESTIONS:  Please feel free to call our office if you have any questions, and we will be glad to assist you.

## 2020-03-22 NOTE — Progress Notes (Signed)
03/22/2020  HPI: Melissa Macdonald is a 25 y.o. female s/p robotic assisted cholecystectomy with ICG cholangiogram on 03/16/20.  The gallbladder was very inflamed and distended, and there was significant inflammation at the neck of the gallbladder and cystic duct.  There was a small tear in the cystic duct during dissection and it was decided to use the stapler across the cystic duct instead of just clips.  Drain was left in place.  Today, she presents for follow up. She reports she's been doing well, without any worsening pain, nausea, vomiting.  She's tolerating po intake well.  The drain has remained serosanguinous.  Vital signs: BP 103/71   Pulse 71   Temp 98.8 F (37.1 C)   Ht 5\' 2"  (1.575 m)   Wt 135 lb 9.6 oz (61.5 kg)   LMP 03/03/2020 (Exact Date)   SpO2 98%   BMI 24.80 kg/m    Physical Exam: Constitutional: No acute distress Abdomen:  Soft, non-distended, non-tender to palpation.  Incisions are healing well, clean, dry, intact.  There is some mild ecchymosis at the umbilical incision.  Drain is serosanguinous.  Removed at bedside without issues and dry gauze dressing applied.  Assessment/Plan: This is a 25 y.o. female s/p robotic assisted cholecystectomy.  --Reviewed intraoperative findings again with the patient and the need for stapler use instead of just clips.  Her drain has remained serosanguinous.  Removed at bedside without complications and dry gauze dressing applied.  Instructed on changing once daily until wound sealed. --Follow up in two weeks.   22, MD [facility]

## 2020-04-05 ENCOUNTER — Encounter: Payer: Self-pay | Admitting: Surgery

## 2020-04-05 ENCOUNTER — Other Ambulatory Visit: Payer: Self-pay

## 2020-04-05 ENCOUNTER — Ambulatory Visit (INDEPENDENT_AMBULATORY_CARE_PROVIDER_SITE_OTHER): Payer: PRIVATE HEALTH INSURANCE | Admitting: Surgery

## 2020-04-05 VITALS — BP 116/81 | HR 85 | Temp 98.9°F | Ht 62.0 in | Wt 139.0 lb

## 2020-04-05 DIAGNOSIS — K8000 Calculus of gallbladder with acute cholecystitis without obstruction: Secondary | ICD-10-CM

## 2020-04-05 DIAGNOSIS — Z09 Encounter for follow-up examination after completed treatment for conditions other than malignant neoplasm: Secondary | ICD-10-CM

## 2020-04-05 NOTE — Progress Notes (Signed)
04/05/2020  HPI: Zakiyah Diop is a 25 y.o. female s/p robotic assisted cholecystectomy on 03/16/20.  Drain was left in place and removed in office on 03/22/20.  Today she presents for follow up.  She reports she's doing well, without complications.  Eating well, having bowel function, without any pain.  No issues with the incisions.  Vital signs: BP 116/81   Pulse 85   Temp 98.9 F (37.2 C)   Ht 5\' 2"  (1.575 m)   Wt 139 lb (63 kg)   LMP 03/31/2020   SpO2 97%   BMI 25.42 kg/m    Physical Exam: Constitutional:  No acute distress Abdomen:  Soft, non-distended, non-tender to palpation.  Prior drain site is closed with scab covering.  Other incisions clean, dry, intact.  Remaining dermabond removed.   Assessment/Plan: This is a 25 y.o. female s/p robotic assisted cholecystectomy  --Patient is doing well, without any complications. --Continue activity restrictions until 4 weeks post-op, then start increasing activity slowly and at 6 weeks, no further restrictions. --OK to travel out of country. --Follow up as needed.   22, MD [facility]

## 2020-04-05 NOTE — Patient Instructions (Addendum)
Please call us if you have any questions or concerns.  Continue your activity restrictions for total of 4 weeks, and then gradually increase activity.  At 6 weeks' time, you have no further restrictions.  OK to apply ointments to the incisions now to help fade the scar.  Vitamin E lotion may work very well for this.    GENERAL POST-OPERATIVE PATIENT INSTRUCTIONS   WOUND CARE INSTRUCTIONS:  Keep a dry clean dressing on the wound if there is drainage. The initial bandage may be removed after 24 hours.  Once the wound has quit draining you may leave it open to air.  If clothing rubs against the wound or causes irritation and the wound is not draining you may cover it with a dry dressing during the daytime.  Try to keep the wound dry and avoid ointments on the wound unless directed to do so.  If the wound becomes bright red and painful or starts to drain infected material that is not clear, please contact your physician immediately.  If the wound is mildly pink and has a thick firm ridge underneath it, this is normal, and is referred to as a healing ridge.  This will resolve over the next 4-6 weeks.  BATHING: You may shower if you have been informed of this by your surgeon. However, Please do not submerge in a tub, hot tub, or pool until incisions are completely sealed or have been told by your surgeon that you may do so.  DIET:  You may eat any foods that you can tolerate.  It is a good idea to eat a high fiber diet and take in plenty of fluids to prevent constipation.  If you do become constipated you may want to take a mild laxative or take ducolax tablets on a daily basis until your bowel habits are regular.  Constipation can be very uncomfortable, along with straining, after recent surgery.  ACTIVITY:  You are encouraged to cough and deep breath or use your incentive spirometer if you were given one, every 15-30 minutes when awake.  This will help prevent respiratory complications and low grade fevers  post-operatively if you had a general anesthetic.  You may want to hug a pillow when coughing and sneezing to add additional support to the surgical area, if you had abdominal or chest surgery, which will decrease pain during these times.  You are encouraged to walk and engage in light activity for the next two weeks.  You should not lift more than 20 pounds for 4 weeks after surgery as it could put you at increased risk for complications.  Twenty pounds is roughly equivalent to a plastic bag of groceries. At that time- Listen to your body when lifting, if you have pain when lifting, stop and then try again in a few days. Soreness after doing exercises or activities of daily living is normal as you get back in to your normal routine.  MEDICATIONS:  Try to take narcotic medications and anti-inflammatory medications, such as tylenol, ibuprofen, naprosyn, etc., with food.  This will minimize stomach upset from the medication.  Should you develop nausea and vomiting from the pain medication, or develop a rash, please discontinue the medication and contact your physician.  You should not drive, make important decisions, or operate machinery when taking narcotic pain medication.  SUNBLOCK Use sun block to incision area over the next year if this area will be exposed to sun. This helps decrease scarring and will allow you avoid a  permanent darkened area over your incision.  QUESTIONS:  Please feel free to call our office if you have any questions, and we will be glad to assist you.

## 2022-02-04 IMAGING — CR DG CHEST 2V
1 series · 2 of 2 positions shown · non-contrast
Comparison: None.

CLINICAL DATA: Chest pain

EXAM:
CHEST - 2 VIEW

[Series 1: dg chest 2 view · 0.14mm/px · 2 of 2 slices shown]
[im 1/2]
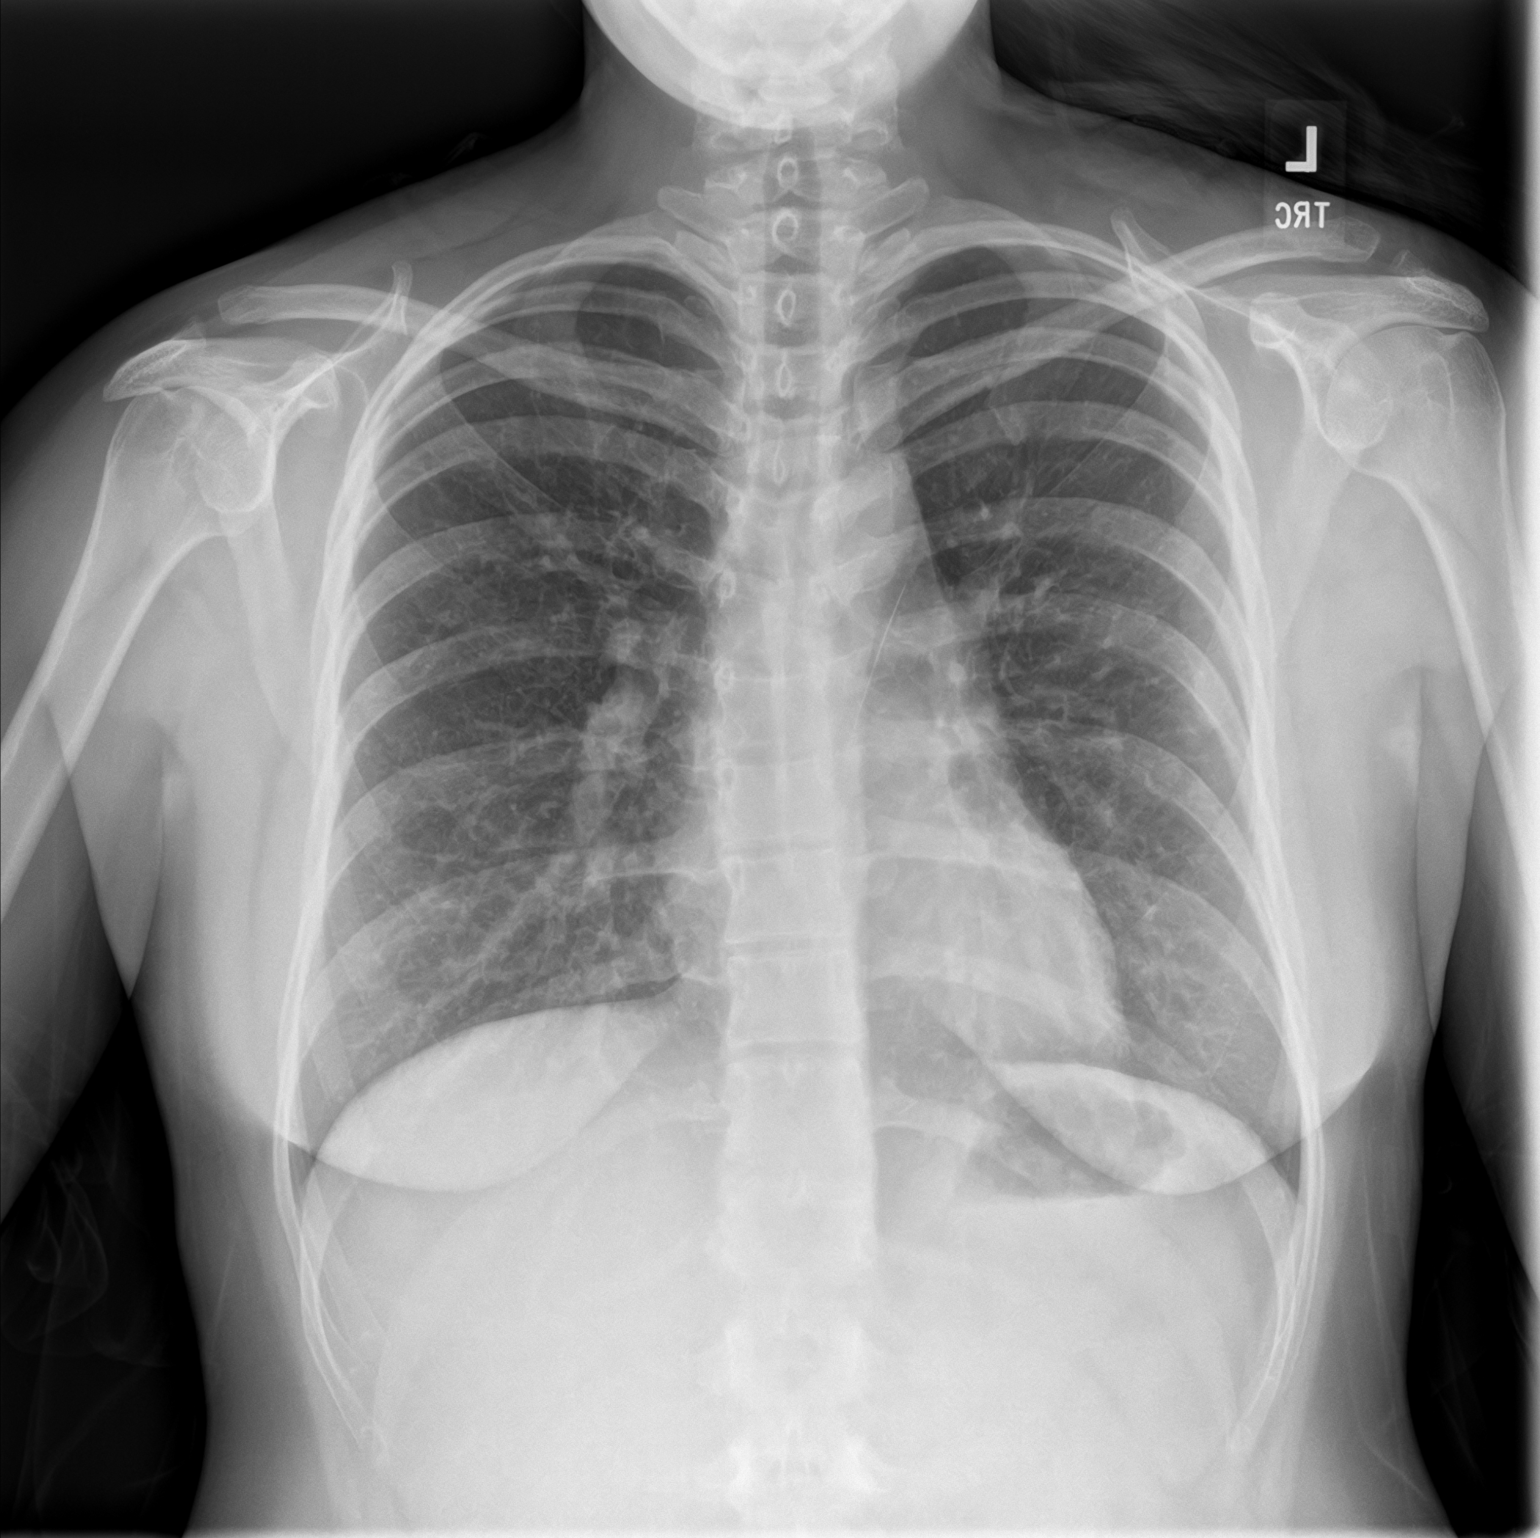
[im 2/2]
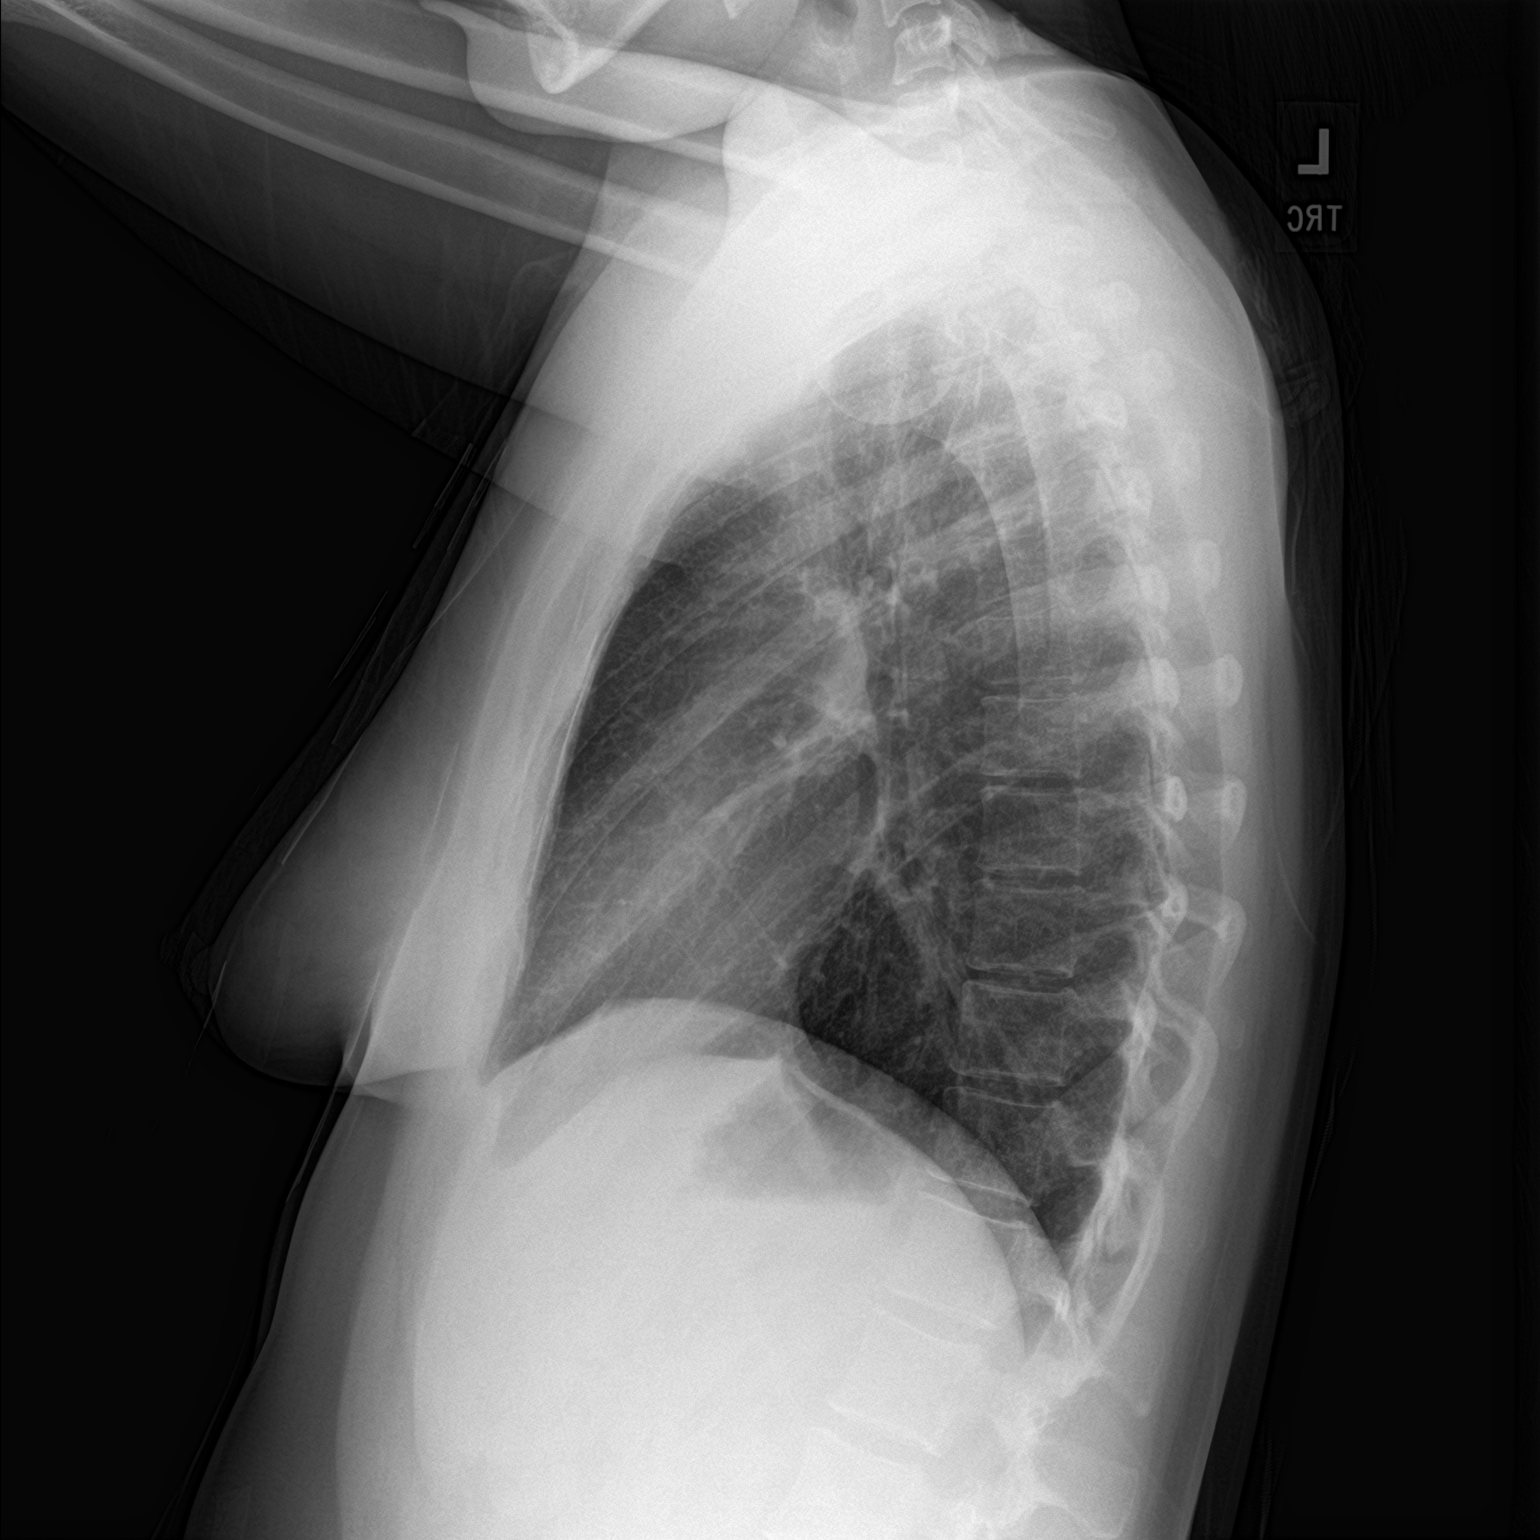

[2 of 2 positions shown; findings below may reference images not displayed]

FINDINGS: Normal heart size and mediastinal contours. No acute infiltrate or
edema. No effusion or pneumothorax. No acute osseous findings.
IMPRESSION: No active cardiopulmonary disease.

## 2022-03-29 IMAGING — US US ABDOMEN LIMITED
1 series · 14 of 25 positions shown · non-contrast
Comparison: None.

CLINICAL DATA: Initial evaluation for acute epigastric pain.

EXAM:
ULTRASOUND ABDOMEN LIMITED RIGHT UPPER QUADRANT

[Series 1: us abdomen limited ruq (liver/gb) · 14 of 40 slices shown]
[im 1/40]
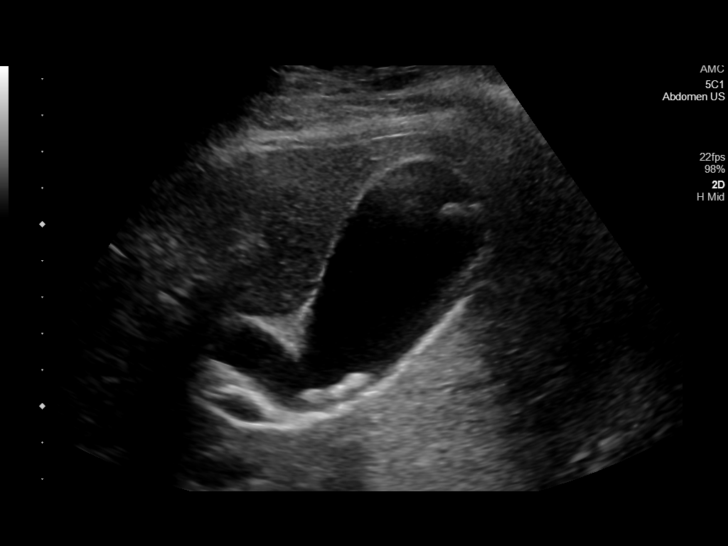
[im 4/40]
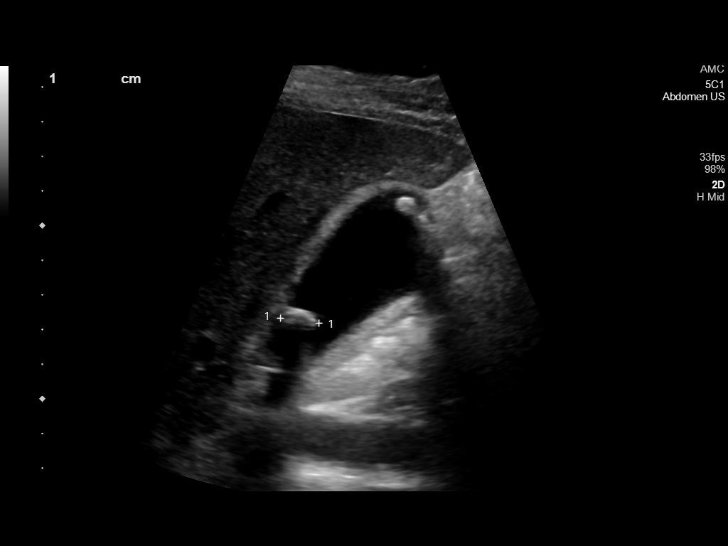
[im 7/40]
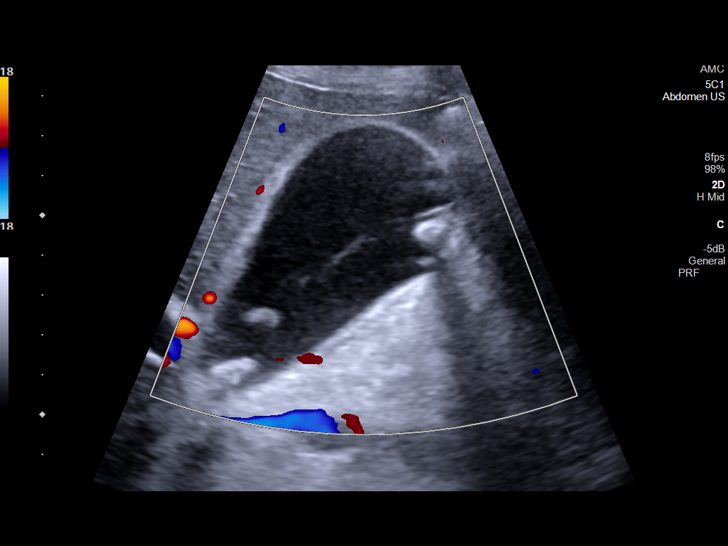
[im 10/40]
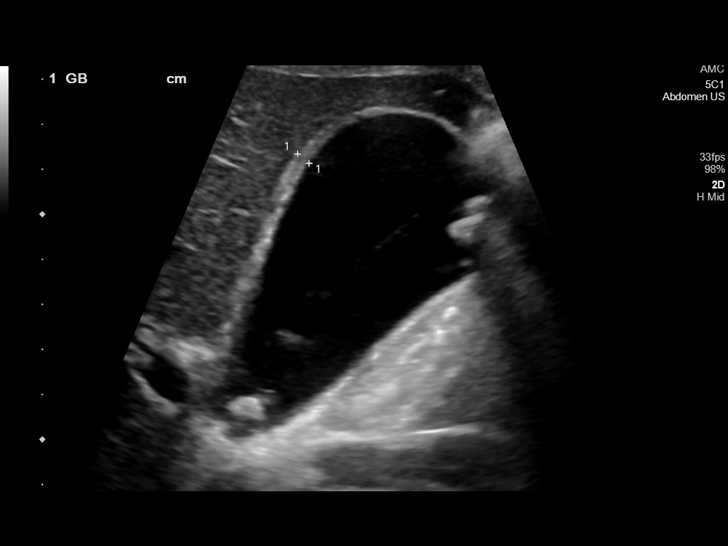
[im 14/40]
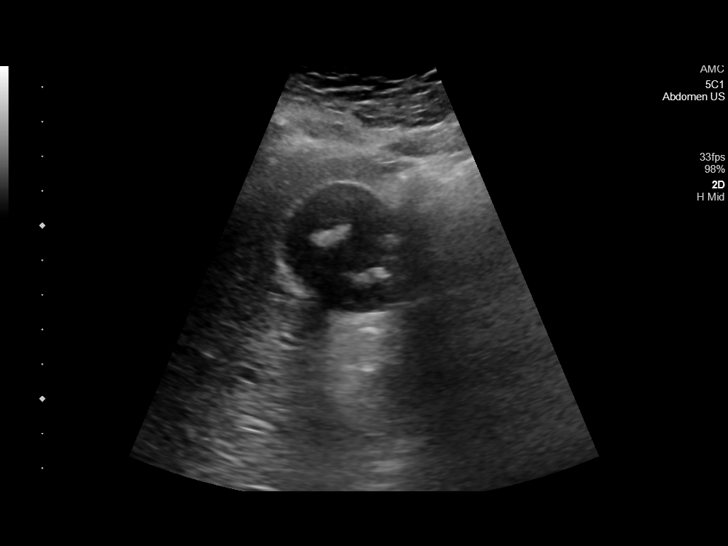
[im 15/40]
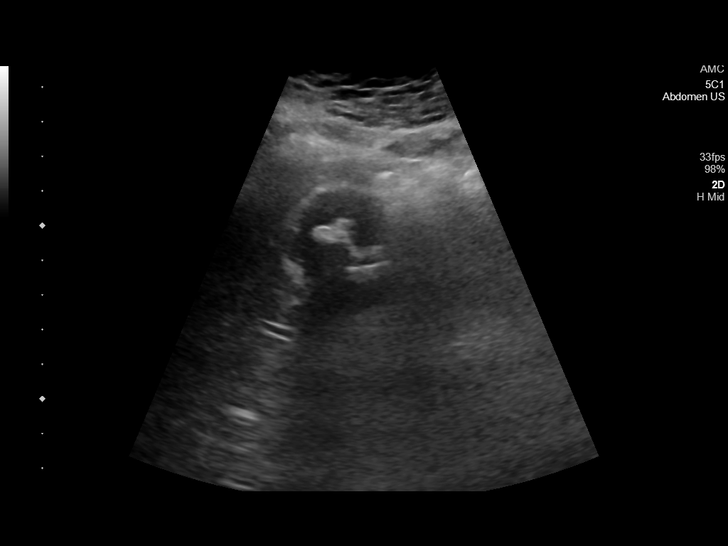
[im 18/40]
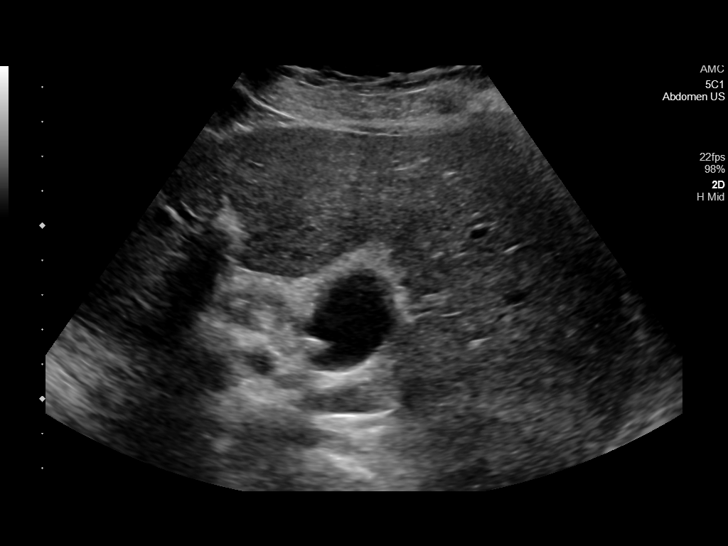
[im 22/40]
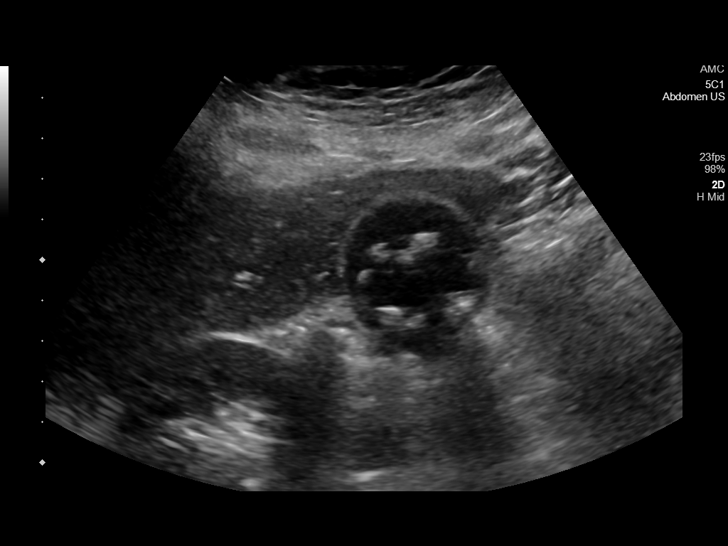
[im 25/40]
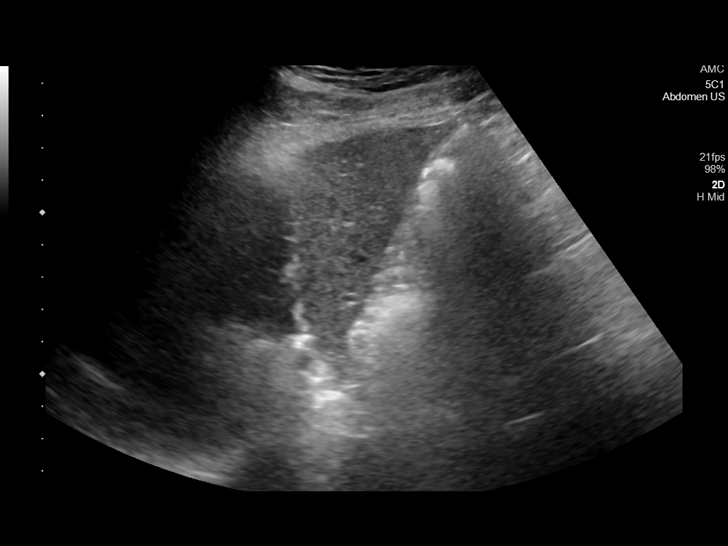
[im 27/40]
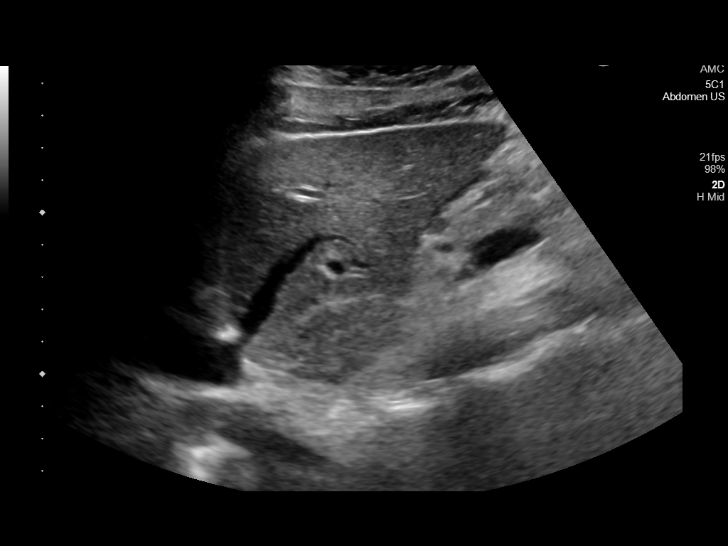
[im 30/40]
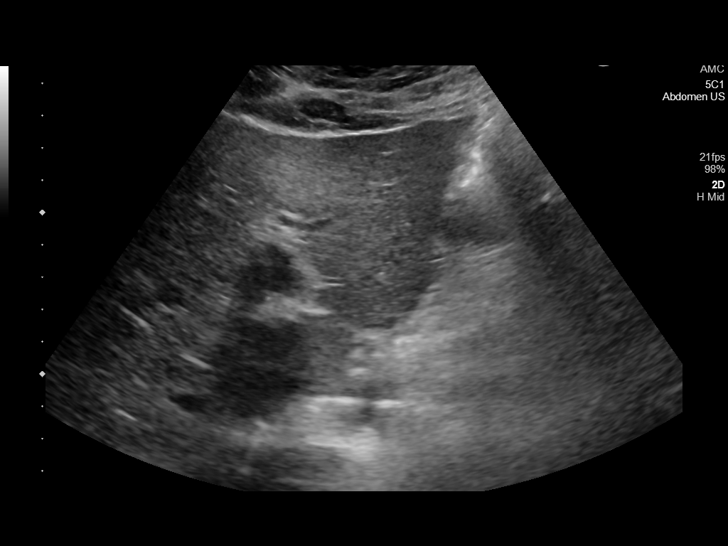
[im 33/40]
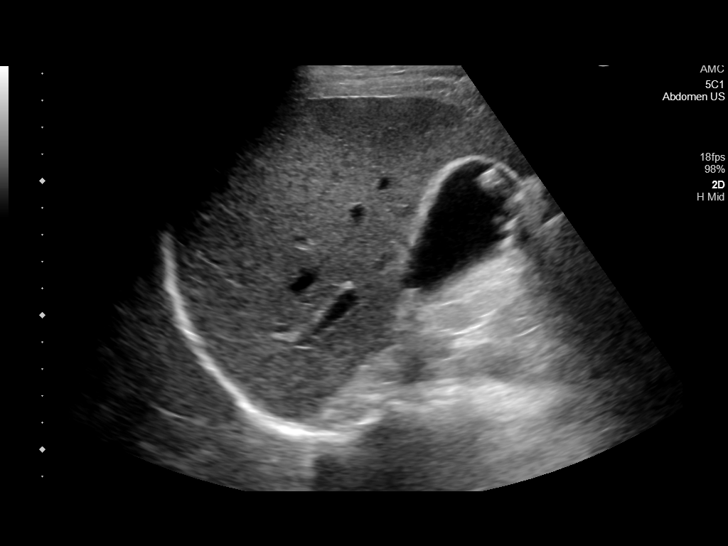
[im 36/40]
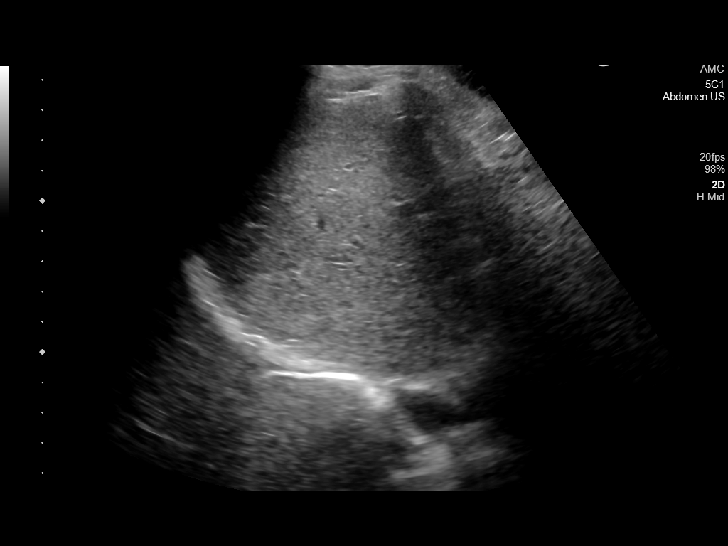
[im 40/40]
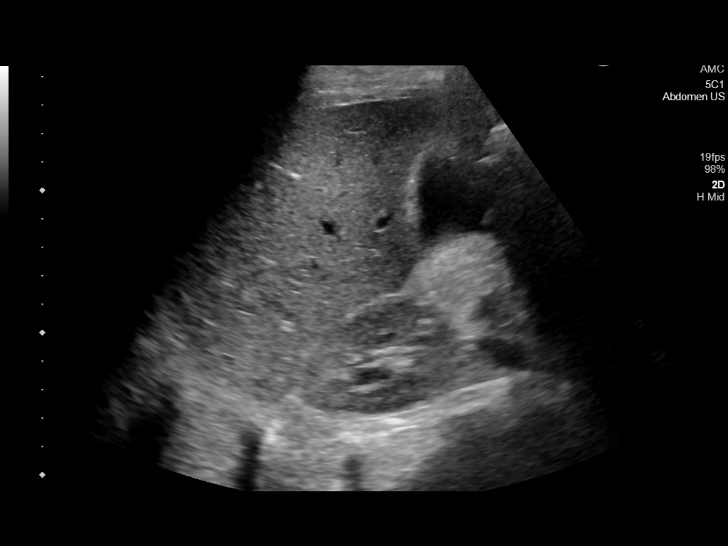

[14 of 25 positions shown; findings below may reference images not displayed]

FINDINGS: Gallbladder:

Gallbladder mildly distended. Multiple scattered echogenic stones
seen within the gallbladder lumen, largest of which measures 1.1 cm.
Superimposed sludge. Extension into the proximal cystic duct noted.
Gallbladder minimally thickened up to 3.3 mm. No free
pericholecystic fluid. No sonographic Murphy sign elicited on exam.

Common bile duct:

Diameter: 3.9 mm

Liver:

No focal lesion identified. Within normal limits in parenchymal
echogenicity. Portal vein is patent on color Doppler imaging with
normal direction of blood flow towards the liver.

Other: None.
IMPRESSION: 1. Stones and sludge within the gallbladder lumen with extension
into the proximal cystic duct. Associated minimal gallbladder wall
thickening, but no other sonographic features for acute
cholecystitis.
2. No biliary dilatation.
